# Patient Record
Sex: Female | Born: 1945
Health system: Southern US, Community
[De-identification: ages and names within clinical notes are randomized; demographics above are authoritative.]

## PROBLEM LIST (undated history)

## (undated) DIAGNOSIS — N200 Calculus of kidney: Secondary | ICD-10-CM

## (undated) DIAGNOSIS — N390 Urinary tract infection, site not specified: Secondary | ICD-10-CM

## (undated) HISTORY — PX: TONSILLECTOMY: SUR1361

## (undated) HISTORY — DX: Calculus of kidney: N20.0

## (undated) HISTORY — PX: OTHER SURGICAL HISTORY: SHX169

## (undated) HISTORY — PX: APPENDECTOMY: SHX54

---

## 1997-05-19 ENCOUNTER — Other Ambulatory Visit: Admission: RE | Admit: 1997-05-19 | Discharge: 1997-05-19 | Payer: Self-pay | Admitting: *Deleted

## 1998-04-27 ENCOUNTER — Other Ambulatory Visit: Admission: RE | Admit: 1998-04-27 | Discharge: 1998-04-27 | Payer: Self-pay | Admitting: *Deleted

## 2000-11-13 ENCOUNTER — Other Ambulatory Visit: Admission: RE | Admit: 2000-11-13 | Discharge: 2000-11-13 | Payer: Self-pay | Admitting: *Deleted

## 2001-11-13 ENCOUNTER — Other Ambulatory Visit: Admission: RE | Admit: 2001-11-13 | Discharge: 2001-11-13 | Payer: Self-pay | Admitting: *Deleted

## 2002-09-06 ENCOUNTER — Encounter: Payer: Self-pay | Admitting: *Deleted

## 2002-09-06 ENCOUNTER — Encounter: Admission: RE | Admit: 2002-09-06 | Discharge: 2002-09-06 | Payer: Self-pay | Admitting: *Deleted

## 2002-11-14 ENCOUNTER — Other Ambulatory Visit: Admission: RE | Admit: 2002-11-14 | Discharge: 2002-11-14 | Payer: Self-pay | Admitting: *Deleted

## 2006-05-03 ENCOUNTER — Other Ambulatory Visit: Admission: RE | Admit: 2006-05-03 | Discharge: 2006-05-03 | Payer: Self-pay | Admitting: *Deleted

## 2006-05-12 ENCOUNTER — Encounter: Admission: RE | Admit: 2006-05-12 | Discharge: 2006-05-12 | Payer: Self-pay | Admitting: Neurology

## 2006-05-22 ENCOUNTER — Encounter: Admission: RE | Admit: 2006-05-22 | Discharge: 2006-05-22 | Payer: Self-pay | Admitting: *Deleted

## 2007-06-18 ENCOUNTER — Encounter: Admission: RE | Admit: 2007-06-18 | Discharge: 2007-06-18 | Payer: Self-pay | Admitting: Gynecology

## 2007-06-18 ENCOUNTER — Other Ambulatory Visit: Admission: RE | Admit: 2007-06-18 | Discharge: 2007-06-18 | Payer: Self-pay | Admitting: Gynecology

## 2008-09-02 IMAGING — US US CAROTID DUPLEX BILAT
1 series · 13 of 24 positions shown · non-contrast
Comparison: none

CLINICAL DATA: Headaches.  Small vessel ischemic changes noted by MRI in the periventricular white matter.  
 BILATERAL CAROTID DUPLEX ULTRASOUND:
 No prior ultrasound studies for comparison. 
 The following Doppler flow velocity measurements were obtained (in cm/sec):

[Series 1: unknown · 0.09mm/px · 13 of 57 slices shown]
[im 1/57]
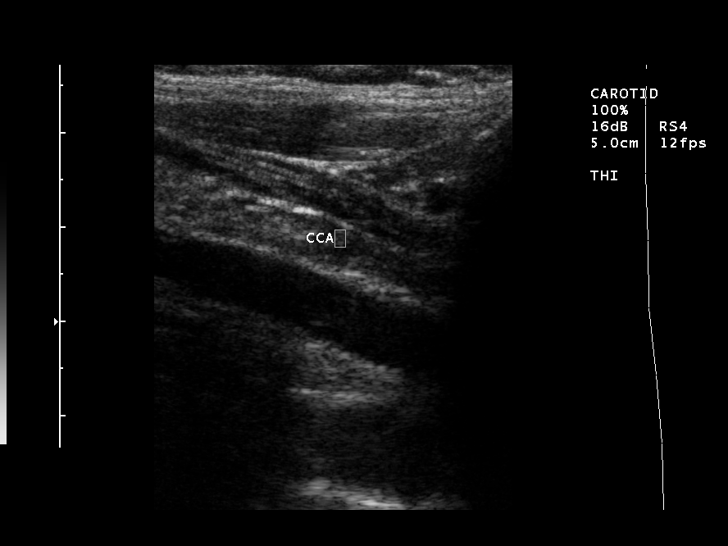
[im 5/57]
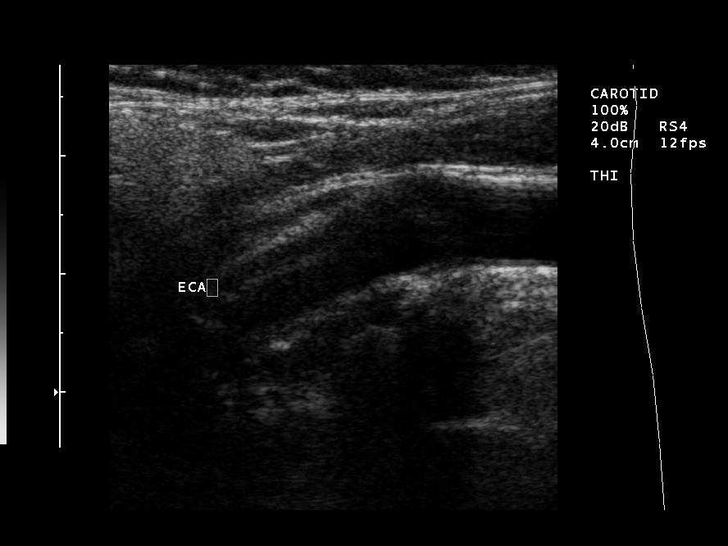
[im 10/57]
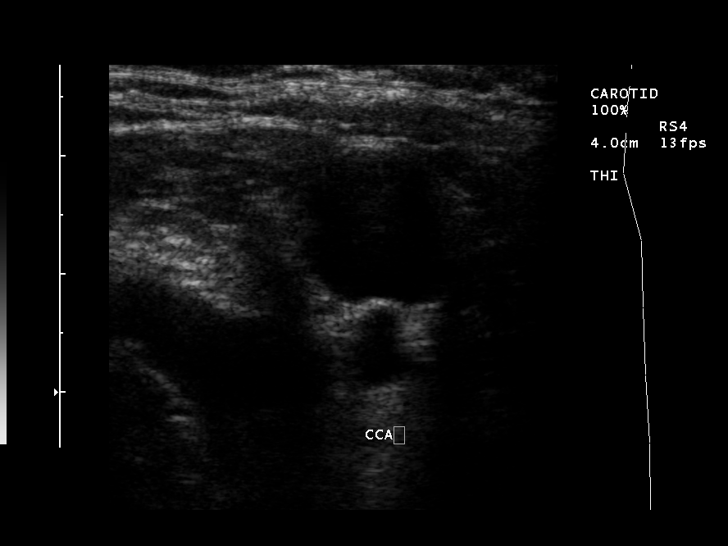
[im 15/57]
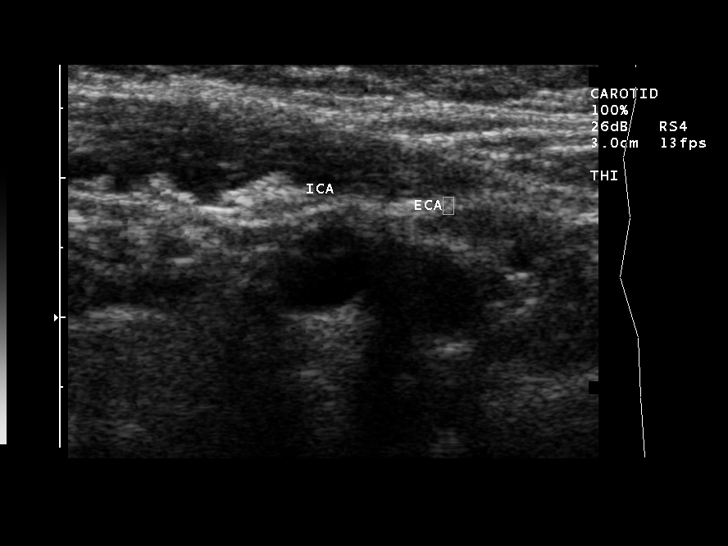
[im 20/57]
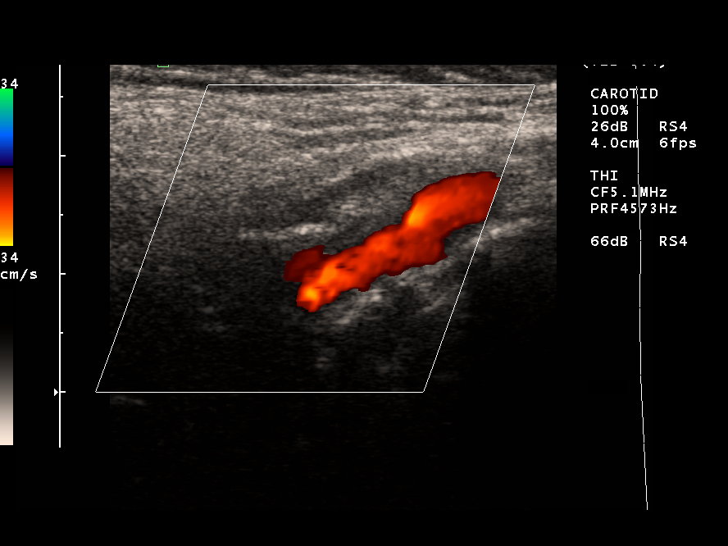
[im 25/57]
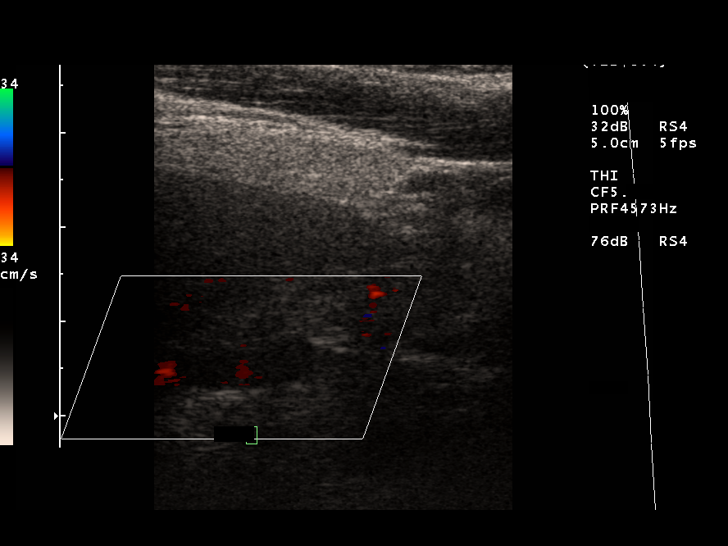
[im 30/57]
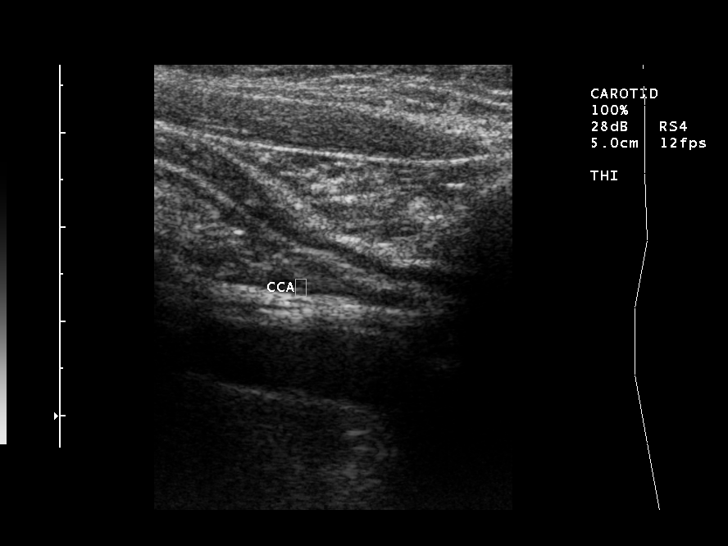
[im 32/57]
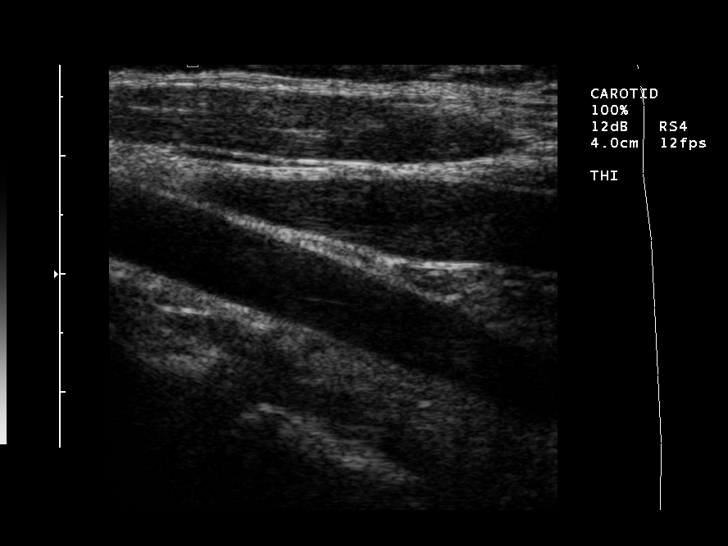
[im 37/57]
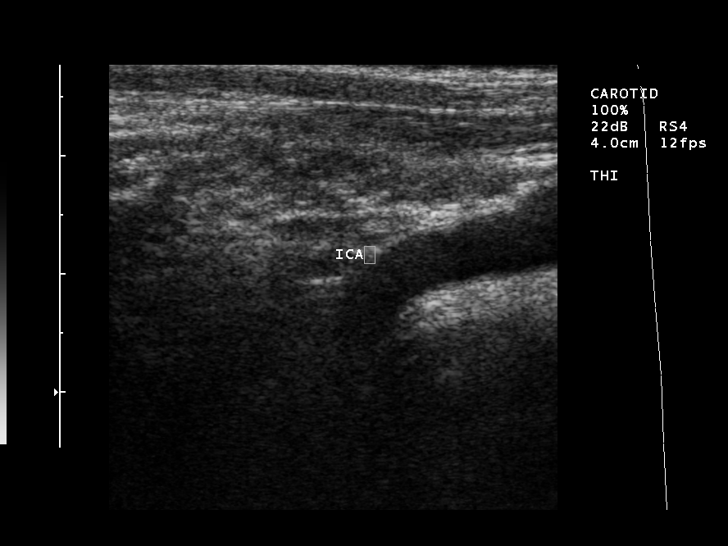
[im 42/57]
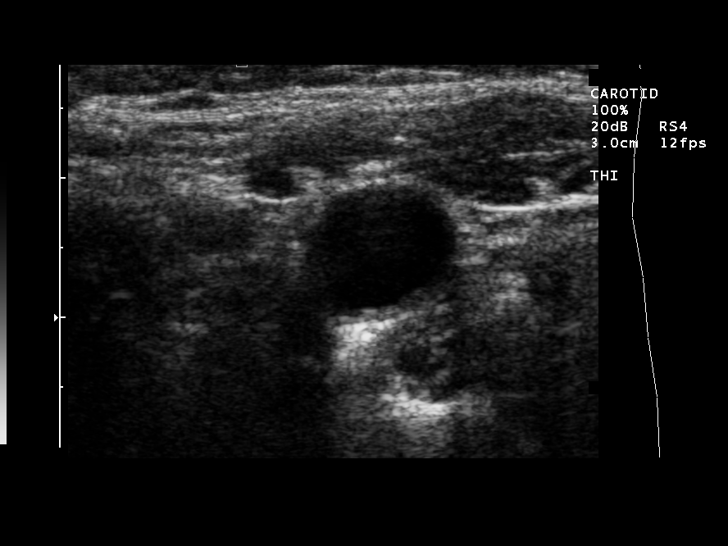
[im 47/57]
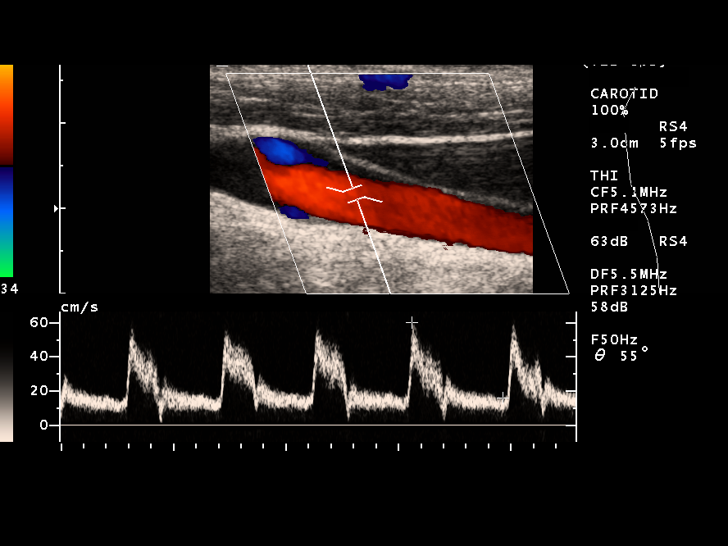
[im 52/57]
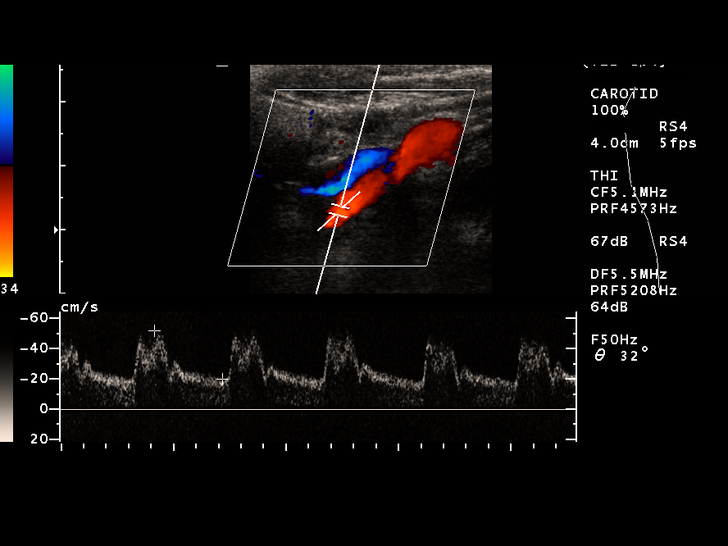
[im 57/57]
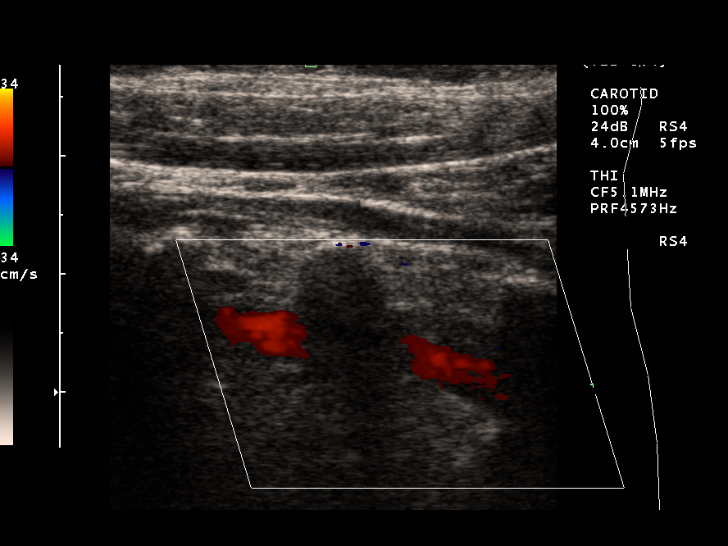

[13 of 24 positions shown; findings below may reference images not displayed]

SITE:  PEAK SYSTOLIC  END-DIASTOLIC

 RIGHT  ICA:    54    19
 RIGHT CCA:  71    22
 RIGHT ICA/CCA RATIO:
 RIGHT ECA:  47
 LEFT ICA:    51  19  
 LEFT CCA:  68  13  
 LEFT ICA/CCA RATIO:
 LEFT ECA:  50
 Criteria:  Quantification of carotid stenosis is based on velocity parameters that correlate the residual internal carotid diameter with NASCET-based stenosis levels.
FINDINGS: The right carotid bifurcation demonstrates a mild amount of calcified plaque at the level of the bulb and extending into the proximal origins of the internal carotid and external carotid arteries.  Waveform analysis demonstrates nonelevated velocities and normal waveforms in the right ICA.  The findings are consistent with an estimated less than 50% right ICA stenosis.  Antegrade flow is detected in the right vertebral artery.
 The left carotid bifurcation demonstrates a similar mild amount of plaque at the carotid bifurcation extending into proximal ICA.  Normal velocities are present with normal waveforms.  Estimated left ICA stenosis is less than 50% based on velocity criteria.  Antegrade flow is detected in the left vertebral artery.
IMPRESSION: No significant carotid occlusive disease identified in the neck by ultrasound.  Estimated less than 50% bilateral ICA stenoses based on velocity criteria. Mild amount of calcified plaque is present in both carotid bifurcations.

## 2012-03-26 ENCOUNTER — Ambulatory Visit: Payer: Self-pay | Admitting: Ophthalmology

## 2012-12-31 ENCOUNTER — Ambulatory Visit: Payer: Self-pay | Admitting: Women's Health

## 2013-01-05 ENCOUNTER — Emergency Department: Payer: Self-pay | Admitting: Emergency Medicine

## 2013-01-05 LAB — URINALYSIS, COMPLETE
Bilirubin,UR: NEGATIVE
GLUCOSE, UR: NEGATIVE mg/dL (ref 0–75)
Hyaline Cast: 1
Ketone: NEGATIVE
Nitrite: NEGATIVE
PH: 5 (ref 4.5–8.0)
Protein: NEGATIVE
RBC,UR: 7 /HPF (ref 0–5)
Specific Gravity: 1.021 (ref 1.003–1.030)
Squamous Epithelial: 13
WBC UR: 25 /HPF (ref 0–5)

## 2013-01-05 LAB — COMPREHENSIVE METABOLIC PANEL
ALBUMIN: 3.5 g/dL (ref 3.4–5.0)
ALK PHOS: 137 U/L — AB
Anion Gap: 3 — ABNORMAL LOW (ref 7–16)
BUN: 15 mg/dL (ref 7–18)
Bilirubin,Total: 0.2 mg/dL (ref 0.2–1.0)
CO2: 29 mmol/L (ref 21–32)
Calcium, Total: 9.2 mg/dL (ref 8.5–10.1)
Chloride: 106 mmol/L (ref 98–107)
Creatinine: 0.88 mg/dL (ref 0.60–1.30)
EGFR (African American): >60
Glucose: 91 mg/dL (ref 65–99)
Osmolality: 276 (ref 275–301)
Potassium: 3.9 mmol/L (ref 3.5–5.1)
SGOT(AST): 44 U/L — ABNORMAL HIGH (ref 15–37)
SGPT (ALT): 44 U/L (ref 12–78)
Sodium: 138 mmol/L (ref 136–145)
Total Protein: 7.8 g/dL (ref 6.4–8.2)

## 2013-01-05 LAB — CBC WITH DIFFERENTIAL/PLATELET
BASOS PCT: 0.6 %
Basophil #: 0 10*3/uL (ref 0.0–0.1)
Eosinophil #: 0.1 10*3/uL (ref 0.0–0.7)
Eosinophil %: 1.1 %
HCT: 42.6 % (ref 35.0–47.0)
HGB: 14.4 g/dL (ref 12.0–16.0)
LYMPHS PCT: 19.7 %
Lymphocyte #: 1.5 10*3/uL (ref 1.0–3.6)
MCH: 30.5 pg (ref 26.0–34.0)
MCHC: 33.8 g/dL (ref 32.0–36.0)
MCV: 90 fL (ref 80–100)
Monocyte #: 0.7 x10 3/mm (ref 0.2–0.9)
Monocyte %: 9.2 %
Neutrophil #: 5.2 10*3/uL (ref 1.4–6.5)
Neutrophil %: 69.4 %
PLATELETS: 272 10*3/uL (ref 150–440)
RBC: 4.72 10*6/uL (ref 3.80–5.20)
RDW: 13.3 % (ref 11.5–14.5)
WBC: 7.6 10*3/uL (ref 3.6–11.0)

## 2013-01-05 LAB — TROPONIN I

## 2013-01-05 LAB — LIPASE, BLOOD: LIPASE: 106 U/L (ref 73–393)

## 2013-01-07 LAB — URINE CULTURE

## 2013-01-29 ENCOUNTER — Ambulatory Visit: Payer: Self-pay | Admitting: Gynecology

## 2013-07-02 ENCOUNTER — Ambulatory Visit: Payer: Self-pay | Admitting: Ophthalmology

## 2013-07-08 ENCOUNTER — Ambulatory Visit: Payer: Self-pay | Admitting: Ophthalmology

## 2014-04-25 NOTE — Op Note (Signed)
PATIENT NAME:  Laura Gomez, Laura Gomez MR#:  [id number] DATE OF BIRTH:  1945-11-25  DATE OF SURGERY:  03/26/2012  PREOPERATIVE DIAGNOSIS: Cataract, left eye.   POSTOPERATIVE DIAGNOSIS: Cataract, left eye.   PROCEDURE PERFORMED: Extracapsular cataract extraction using phacoemulsification with placement of an Alcon SN6CWS, 23.0-diopter posterior chamber lens, serial number [id number].   ANESTHESIA: 4% lidocaine, 0.75% Marcaine in a 50:50 mixture with 10 units/mL of Hylenex added, given as a peribulbar.   ANESTHESIOLOGIST: Dr. Dimple Caseyice  COMPLICATIONS: None.   ESTIMATED BLOOD LOSS: Less than 1 mL.   SURGEON:  Maylon PeppersSteven A. Fruma Africa, MD  ESTIMATED BLOOD LOSS:  Less than 1 ml.  DESCRIPTION OF PROCEDURE:  The patient was brought to the operating room and given a peribulbar block.  The patient was then prepped and draped in the usual fashion.  The vertical rectus muscles were imbricated using 5-0 silk sutures.  These sutures were then clamped to the sterile drapes as bridle sutures.  A limbal peritomy was performed extending two clock hours and hemostasis was obtained with cautery.  A partial thickness scleral groove was made at the surgical limbus and dissected anteriorly in a lamellar dissection using an Alcon crescent knife.  The anterior chamber was entered supero-temporally with a Superblade and through the lamellar dissection with a 2.6 mm keratome.  DisCoVisc was used to replace the aqueous and a continuous tear capsulorrhexis was carried out.  Hydrodissection and hydrodelineation were carried out with balanced salt and a 27 gauge canula.  The nucleus was rotated to confirm the effectiveness of the hydrodissection.  Phacoemulsification was carried out using a divide-and-conquer technique.  Total ultrasound time was 1 minute and 7 seconds, with an average power of 24.3%, CDE of 30.93.  Irrigation/aspiration was used to remove the residual cortex.  DisCoVisc was used to inflate the capsule and the  internal incision was enlarged to 3 mm with the crescent knife.  A suture was placed. The intraocular lens was folded and inserted into the capsular bag using an AcrySert delivery system instead of a ParamedicMonarch shooter. Irrigation/aspiration was used to remove the residual DisCoVisc.  Miostat was injected into the anterior chamber through the paracentesis track to inflate the anterior chamber and induce miosis.  The wound was checked for leaks and none were found. The conjunctiva was closed with cautery and the bridle sutures were removed.  Two drops of 0.3% Vigamox were placed on the eye.   An eye shield was placed on the eye.  The patient was discharged to the recovery room in good condition.    ____________________________ Maylon PeppersSteven A. Sumedh Shinsato, MD sad:dm D: 03/26/2012 14:15:00 ET T: 03/26/2012 14:56:58 ET JOB#: [id number]  cc: Viviann SpareSteven A. Ilsa Bonello, MD, <Dictator> Erline LevineSTEVEN A Rudolph Dobler MD ELECTRONICALLY SIGNED 04/02/2012 13:40

## 2014-04-26 NOTE — Op Note (Signed)
PATIENT NAME:  Laura Gomez, Laura Gomez MR#:  [id number] DATE OF BIRTH:  [birth year]  DATE OF PROCEDURE:  07/08/2013  PREOPERATIVE DIAGNOSIS:  Cataract, right eye.   POSTOPERATIVE DIAGNOSIS:  Cataract, right eye.  PROCEDURE PERFORMED:  Extracapsular cataract extraction using phacoemulsification with placement of an Alcon SN6CWS, 22.0-diopter posterior chamber lens, serial [id number].  SURGEON:  Maylon PeppersSteven A. Mattisyn Cardona, MD  ASSISTANT:  None.  ANESTHESIA:  4% lidocaine and 0.75% Marcaine in a 50/50 mixture with 10 units/mL of Hylenex added, given as a peribulbar.  ANESTHESIOLOGIST:  Dr. Maisie Fushomas.   COMPLICATIONS:  None.  ESTIMATED BLOOD LOSS:  Less than 1 ml.  DESCRIPTION OF PROCEDURE:  The patient was brought to the operating room and given a peribulbar block.  The patient was then prepped and draped in the usual fashion.  The vertical rectus muscles were imbricated using 5-0 silk sutures.  These sutures were then clamped to the sterile drapes as bridle sutures.  A limbal peritomy was performed extending two clock hours and hemostasis was obtained with cautery.  A partial thickness scleral groove was made at the surgical limbus and dissected anteriorly in a lamellar dissection using an Alcon crescent knife.  The anterior chamber was entered superonasally with a Superblade and through the lamellar dissection with a 2.6 mm keratome.  DisCoVisc was used to replace the aqueous and a continuous tear capsulorrhexis was carried out.  Hydrodissection and hydrodelineation were carried out with balanced salt and a 27 gauge canula.  The nucleus was rotated to confirm the effectiveness of the hydrodissection.  Phacoemulsification was carried out using a divide-and-conquer technique.  Total ultrasound time was 1 minute and 21 seconds with an average power of 24.2 percent. CDE of 32.29. No suture was placed.   Irrigation/aspiration was used to remove the residual cortex.  DisCoVisc was used to inflate the capsule  and the internal incision was enlarged to 3 mm with the crescent knife.  The intraocular lens was folded and inserted into the capsular bag using the AcrySert delivery system. Irrigation/aspiration was used to remove the residual DisCoVisc.  Miostat was injected into the anterior chamber through the paracentesis track to inflate the anterior chamber and induce miosis.  A tenth of a milliliter of Vigamox containing 0.1 mg of drug was injected via the paracentesis track. The wound was checked for leaks and none were found. The conjunctiva was closed with cautery and the bridle sutures were removed.  Two drops of 0.3% Vigamox were placed on the eye.   An eye shield was placed on the eye.  The patient was discharged to the recovery room in good condition.   ____________________________ Maylon PeppersSteven A. Marcelis Wissner, MD sad:lt D: 07/08/2013 13:49:54 ET T: 07/09/2013 01:43:23 ET JOB#: [id number]  cc: Viviann SpareSteven A. Benjamine Strout, MD, <Dictator> Erline LevineSTEVEN A Lariza Cothron MD ELECTRONICALLY SIGNED 07/15/2013 12:52

## 2014-08-28 ENCOUNTER — Emergency Department
Admission: EM | Admit: 2014-08-28 | Discharge: 2014-08-29 | Disposition: A | Discharge status: Home / Self Care | Payer: Medicare Other | Attending: Emergency Medicine | Admitting: Emergency Medicine

## 2014-08-28 ENCOUNTER — Encounter: Payer: Self-pay | Admitting: *Deleted

## 2014-08-28 DIAGNOSIS — Z880 Allergy status to penicillin: Secondary | ICD-10-CM | POA: Insufficient documentation

## 2014-08-28 DIAGNOSIS — N200 Calculus of kidney: Secondary | ICD-10-CM

## 2014-08-28 DIAGNOSIS — N390 Urinary tract infection, site not specified: Secondary | ICD-10-CM

## 2014-08-28 DIAGNOSIS — R1031 Right lower quadrant pain: Secondary | ICD-10-CM | POA: Diagnosis present

## 2014-08-28 HISTORY — DX: Urinary tract infection, site not specified: N39.0

## 2014-08-28 LAB — CBC WITH DIFFERENTIAL/PLATELET
BASOS ABS: 0.1 10*3/uL (ref 0–0.1)
Basophils Relative: 1 %
EOS ABS: 0.2 10*3/uL (ref 0–0.7)
EOS PCT: 2 %
HCT: 41.7 % (ref 35.0–47.0)
Hemoglobin: 14.3 g/dL (ref 12.0–16.0)
LYMPHS ABS: 2 10*3/uL (ref 1.0–3.6)
LYMPHS PCT: 19 %
MCH: 30.9 pg (ref 26.0–34.0)
MCHC: 34.3 g/dL (ref 32.0–36.0)
MCV: 90.2 fL (ref 80.0–100.0)
MONO ABS: 0.8 10*3/uL (ref 0.2–0.9)
Monocytes Relative: 8 %
Neutro Abs: 7.4 10*3/uL — ABNORMAL HIGH (ref 1.4–6.5)
Neutrophils Relative %: 70 %
PLATELETS: 289 10*3/uL (ref 150–440)
RBC: 4.63 MIL/uL (ref 3.80–5.20)
RDW: 13.7 % (ref 11.5–14.5)
WBC: 10.4 10*3/uL (ref 3.6–11.0)

## 2014-08-28 LAB — COMPREHENSIVE METABOLIC PANEL
ALT: 15 U/L (ref 14–54)
AST: 25 U/L (ref 15–41)
Albumin: 4 g/dL (ref 3.5–5.0)
Alkaline Phosphatase: 108 U/L (ref 38–126)
Anion gap: 8 (ref 5–15)
BUN: 10 mg/dL (ref 6–20)
CHLORIDE: 105 mmol/L (ref 101–111)
CO2: 26 mmol/L (ref 22–32)
Calcium: 9.5 mg/dL (ref 8.9–10.3)
Creatinine, Ser: 1.02 mg/dL — ABNORMAL HIGH (ref 0.44–1.00)
GFR, EST NON AFRICAN AMERICAN: 55 mL/min — AB (ref >60)
Glucose, Bld: 114 mg/dL — ABNORMAL HIGH (ref 65–99)
POTASSIUM: 3.6 mmol/L (ref 3.5–5.1)
SODIUM: 139 mmol/L (ref 135–145)
Total Bilirubin: 0.5 mg/dL (ref 0.3–1.2)
Total Protein: 7.8 g/dL (ref 6.5–8.1)

## 2014-08-28 LAB — URINALYSIS COMPLETE WITH MICROSCOPIC (ARMC ONLY)
BACTERIA UA: NONE SEEN
BILIRUBIN URINE: NEGATIVE
Glucose, UA: NEGATIVE mg/dL
HGB URINE DIPSTICK: NEGATIVE
KETONES UR: NEGATIVE mg/dL
NITRITE: NEGATIVE
PH: 6 (ref 5.0–8.0)
Protein, ur: NEGATIVE mg/dL
RBC / HPF: NONE SEEN RBC/hpf (ref 0–5)
SPECIFIC GRAVITY, URINE: 1.019 (ref 1.005–1.030)

## 2014-08-28 MED ORDER — SODIUM CHLORIDE 0.9 % IV SOLN
1000.0000 mL | Freq: Once | INTRAVENOUS | Status: AC
  Administered 2014-08-28: 1000 mL via INTRAVENOUS

## 2014-08-28 MED ORDER — MORPHINE SULFATE (PF) 4 MG/ML IV SOLN
4.0000 mg | Freq: Once | INTRAVENOUS | Status: AC
  Administered 2014-08-28: 4 mg via INTRAVENOUS
  Filled 2014-08-28: qty 1

## 2014-08-28 MED ORDER — ONDANSETRON HCL 4 MG/2ML IJ SOLN
4.0000 mg | Freq: Once | INTRAMUSCULAR | Status: AC
  Administered 2014-08-28: 4 mg via INTRAVENOUS
  Filled 2014-08-28: qty 2

## 2014-08-28 NOTE — ED Notes (Signed)
Patient c/o right groin pain that began yesterday. Patient states she cannot weight-bear on right leg due to the pain. Patient states she has a hx. Of UTI and the pain is in the same place. Patient denies frequency, pain or burning with urination.

## 2014-08-29 ENCOUNTER — Emergency Department: Payer: Medicare Other

## 2014-08-29 ENCOUNTER — Encounter: Payer: Self-pay | Admitting: Emergency Medicine

## 2014-08-29 ENCOUNTER — Emergency Department
Admission: EM | Admit: 2014-08-29 | Discharge: 2014-08-29 | Disposition: A | Discharge status: Home / Self Care | Payer: Medicare Other | Source: Home / Self Care | Attending: Emergency Medicine | Admitting: Emergency Medicine

## 2014-08-29 DIAGNOSIS — N3090 Cystitis, unspecified without hematuria: Secondary | ICD-10-CM | POA: Insufficient documentation

## 2014-08-29 DIAGNOSIS — Z792 Long term (current) use of antibiotics: Secondary | ICD-10-CM

## 2014-08-29 DIAGNOSIS — Z880 Allergy status to penicillin: Secondary | ICD-10-CM | POA: Insufficient documentation

## 2014-08-29 LAB — URINALYSIS COMPLETE WITH MICROSCOPIC (ARMC ONLY)
Bacteria, UA: NONE SEEN
Bilirubin Urine: NEGATIVE
GLUCOSE, UA: NEGATIVE mg/dL
HGB URINE DIPSTICK: NEGATIVE
Nitrite: NEGATIVE
PH: 5 (ref 5.0–8.0)
Protein, ur: NEGATIVE mg/dL
SPECIFIC GRAVITY, URINE: 1.023 (ref 1.005–1.030)

## 2014-08-29 LAB — COMPREHENSIVE METABOLIC PANEL
ALBUMIN: 3.9 g/dL (ref 3.5–5.0)
ALK PHOS: 100 U/L (ref 38–126)
ALT: 14 U/L (ref 14–54)
AST: 22 U/L (ref 15–41)
Anion gap: 8 (ref 5–15)
BUN: 8 mg/dL (ref 6–20)
CALCIUM: 9.3 mg/dL (ref 8.9–10.3)
CO2: 26 mmol/L (ref 22–32)
CREATININE: 0.81 mg/dL (ref 0.44–1.00)
Chloride: 104 mmol/L (ref 101–111)
GFR calc Af Amer: >60 mL/min (ref >60)
GFR calc non Af Amer: >60 mL/min (ref >60)
GLUCOSE: 118 mg/dL — AB (ref 65–99)
Potassium: 3.7 mmol/L (ref 3.5–5.1)
SODIUM: 138 mmol/L (ref 135–145)
Total Bilirubin: 0.8 mg/dL (ref 0.3–1.2)
Total Protein: 7.3 g/dL (ref 6.5–8.1)

## 2014-08-29 LAB — CBC
HCT: 40.2 % (ref 35.0–47.0)
Hemoglobin: 13.2 g/dL (ref 12.0–16.0)
MCH: 29.9 pg (ref 26.0–34.0)
MCHC: 32.8 g/dL (ref 32.0–36.0)
MCV: 91.1 fL (ref 80.0–100.0)
PLATELETS: 286 10*3/uL (ref 150–440)
RBC: 4.42 MIL/uL (ref 3.80–5.20)
RDW: 13.3 % (ref 11.5–14.5)
WBC: 9.8 10*3/uL (ref 3.6–11.0)

## 2014-08-29 MED ORDER — SODIUM CHLORIDE 0.9 % IV BOLUS (SEPSIS)
1000.0000 mL | Freq: Once | INTRAVENOUS | Status: AC
  Administered 2014-08-29: 1000 mL via INTRAVENOUS

## 2014-08-29 MED ORDER — MORPHINE SULFATE (PF) 2 MG/ML IV SOLN
2.0000 mg | Freq: Once | INTRAVENOUS | Status: AC
  Administered 2014-08-29: 2 mg via INTRAVENOUS
  Filled 2014-08-29: qty 1

## 2014-08-29 MED ORDER — NITROFURANTOIN MONOHYD MACRO 100 MG PO CAPS
100.0000 mg | ORAL_CAPSULE | Freq: Once | ORAL | Status: AC
  Administered 2014-08-29: 100 mg via ORAL
  Filled 2014-08-29: qty 1

## 2014-08-29 MED ORDER — HYDROCODONE-ACETAMINOPHEN 5-325 MG PO TABS: 1.0000 | ORAL_TABLET | Freq: Four times a day (QID) | ORAL | Status: DC | PRN

## 2014-08-29 MED ORDER — ONDANSETRON HCL 4 MG/2ML IJ SOLN
4.0000 mg | Freq: Once | INTRAMUSCULAR | Status: AC
  Administered 2014-08-29: 4 mg via INTRAVENOUS
  Filled 2014-08-29: qty 2

## 2014-08-29 MED ORDER — IBUPROFEN 600 MG PO TABS: 600.0000 mg | ORAL_TABLET | Freq: Three times a day (TID) | ORAL | Status: AC | PRN

## 2014-08-29 MED ORDER — IOHEXOL 240 MG/ML SOLN
50.0000 mL | INTRAMUSCULAR | Status: AC
  Administered 2014-08-29: 50 mL via ORAL

## 2014-08-29 MED ORDER — ONDANSETRON 4 MG PO TBDP
4.0000 mg | ORAL_TABLET | Freq: Once | ORAL | Status: AC
  Administered 2014-08-29: 4 mg via ORAL
  Filled 2014-08-29: qty 1

## 2014-08-29 MED ORDER — SULFAMETHOXAZOLE-TRIMETHOPRIM 800-160 MG PO TABS: 1.0000 | ORAL_TABLET | Freq: Once | ORAL | Status: DC

## 2014-08-29 MED ORDER — HYDROCODONE-ACETAMINOPHEN 5-325 MG PO TABS
1.0000 | ORAL_TABLET | Freq: Once | ORAL | Status: AC
  Administered 2014-08-29: 1 via ORAL
  Filled 2014-08-29: qty 1

## 2014-08-29 MED ORDER — KETOROLAC TROMETHAMINE 30 MG/ML IJ SOLN
30.0000 mg | Freq: Once | INTRAMUSCULAR | Status: AC
  Administered 2014-08-29: 30 mg via INTRAVENOUS
  Filled 2014-08-29: qty 1

## 2014-08-29 MED ORDER — NITROFURANTOIN MONOHYD MACRO 100 MG PO CAPS: 100.0000 mg | ORAL_CAPSULE | Freq: Two times a day (BID) | ORAL | Status: DC

## 2014-08-29 MED ORDER — IOHEXOL 300 MG/ML  SOLN
100.0000 mL | Freq: Once | INTRAMUSCULAR | Status: AC | PRN
  Administered 2014-08-29: 100 mL via INTRAVENOUS

## 2014-08-29 NOTE — Discharge Instructions (Signed)
1. Take antibiotic as prescribed (Macrobid 100 mg twice daily 5 days). 2. Take pain medicines as needed (Motrin/Norco #15). 3. Return to the ER for worsening symptoms, persistent vomiting, fever or other concerns.  Urinary Tract Infection A urinary tract infection (UTI) can occur any place along the urinary tract. The tract includes the kidneys, ureters, bladder, and urethra. A type of germ called bacteria often causes a UTI. UTIs are often helped with antibiotic medicine.  HOME CARE   If given, take antibiotics as told by your doctor. Finish them even if you start to feel better.  Drink enough fluids to keep your pee (urine) clear or pale yellow.  Avoid tea, drinks with caffeine, and bubbly (carbonated) drinks.  Pee often. Avoid holding your pee in for a long time.  Pee before and after having sex (intercourse).  Wipe from front to back after you poop (bowel movement) if you are a woman. Use each tissue only once. GET HELP RIGHT AWAY IF:   You have back pain.  You have lower belly (abdominal) pain.  You have chills.  You feel sick to your stomach (nauseous).  You throw up (vomit).  Your burning or discomfort with peeing does not go away.  You have a fever.  Your symptoms are not better in 3 days. MAKE SURE YOU:   Understand these instructions.  Will watch your condition.  Will get help right away if you are not doing well or get worse. Document Released: 06/08/2007 Document Revised: 09/14/2011 Document Reviewed: 07/21/2011 [facility] Patient Information 2015 [place], [place]. This information is not intended to replace advice given to you by your health care provider. Make sure you discuss any questions you have with your health care provider.  Kidney Stones Kidney stones (urolithiasis) are solid masses that form inside your kidneys. The intense pain is caused by the stone moving through the kidney, ureter, bladder, and urethra (urinary tract). When the stone moves, the  ureter starts to spasm around the stone. The stone is usually passed in your pee (urine).  HOME CARE  Drink enough fluids to keep your pee clear or pale yellow. This helps to get the stone out.  Strain all pee through the provided strainer. Do not pee without peeing through the strainer, not even once. If you pee the stone out, catch it in the strainer. The stone may be as small as a grain of salt. Take this to your doctor. This will help your doctor figure out what you can do to try to prevent more kidney stones.  Only take medicine as told by your doctor.  Follow up with your doctor as told.  Get follow-up X-rays as told by your doctor. GET HELP IF: You have pain that gets worse even if you have been taking pain medicine. GET HELP RIGHT AWAY IF:   Your pain does not get better with medicine.  You have a fever or shaking chills.  Your pain increases and gets worse over 18 hours.  You have new belly (abdominal) pain.  You feel faint or pass out.  You are unable to pee. MAKE SURE YOU:   Understand these instructions.  Will watch your condition.  Will get help right away if you are not doing well or get worse. Document Released: 06/08/2007 Document Revised: 08/22/2012 Document Reviewed: 05/23/2012 [facility] Patient Information 2015 [place], [place]. This information is not intended to replace advice given to you by your health care provider. Make sure you discuss any questions you have with  your health care provider. ° °

## 2014-08-29 NOTE — ED Notes (Signed)
Pt. Going home with husband. 

## 2014-08-29 NOTE — ED Provider Notes (Signed)
[facility] Emergency Department Provider Note  ____________________________________________  Time seen: 6:55 PM  I have reviewed the triage vital signs and the nursing notes.   HISTORY  Chief Complaint Flank Pain    HPI Laura Gomez is a 69 y.o. female who complains of right lower quadrant abdominal pain for 2 days. She was seen in the ED last night for the same had a noncontrast CT scan which showed some small punctate nephrolithiasis and she was given Macrobid for possible urinary tract infection. She reports the pain is still ongoing despite taking 1 or 2 doses of Macrobid today. She has NSAIDs and Norco for pain. Denies any nausea vomiting diarrhea fever chills chest pain shortness of breath dizziness or syncope. Eating and drinking normally     Past Medical History  Diagnosis Date  . UTI (lower urinary tract infection)     There are no active problems to display for this patient.   Past Surgical History  Procedure Laterality Date  . Appendectomy    . Tonsillectomy    . Cesarean section      Current Outpatient Rx  Name  Route  Sig  Dispense  Refill  . HYDROcodone-acetaminophen (NORCO) 5-325 MG per tablet   Oral   Take 1 tablet by mouth every 6 (six) hours as needed for moderate pain.   15 tablet   0   . ibuprofen (ADVIL,MOTRIN) 600 MG tablet   Oral   Take 1 tablet (600 mg total) by mouth every 8 (eight) hours as needed.   15 tablet   0   . nitrofurantoin, macrocrystal-monohydrate, (MACROBID) 100 MG capsule   Oral   Take 1 capsule (100 mg total) by mouth 2 (two) times daily.   10 capsule   0     Allergies Penicillins  History reviewed. No pertinent family history.  Social History Social History  Substance Use Topics  . Smoking status: Never Smoker   . Smokeless tobacco: None  . Alcohol Use: No    Review of Systems  Constitutional: No fever or chills. No weight changes Eyes:No blurry vision or double vision.   ENT: No sore throat. Cardiovascular: No chest pain. Respiratory: No dyspnea or cough. Gastrointestinal: Right  lower quadrant pain as above without vomiting or diarrhea. No brBPR or melena. Genitourinary: Negative for dysuria, urinary retention, bloody urine, or difficulty urinating. Musculoskeletal: Negative for back pain. No joint swelling or pain. Skin: Negative for rash. Neurological: Negative for headaches, focal weakness or numbness. Psychiatric:No anxiety or depression.   Endocrine:No hot/cold intolerance, changes in energy, or sleep difficulty.  10-point ROS otherwise negative.  ____________________________________________   PHYSICAL EXAM:  VITAL SIGNS: ED Triage Vitals  Enc Vitals Group     BP 08/29/14 1628 120/63 mmHg     Pulse Rate 08/29/14 1628 77     Resp 08/29/14 1628 16     Temp 08/29/14 1628 97.6 F (36.4 C)     Temp Source 08/29/14 1628 Oral     SpO2 08/29/14 1628 96 %     Weight 08/29/14 1628 147 lb (66.679 kg)     Height 08/29/14 1628 5' (1.524 m)     Head Cir --      Peak Flow --      Pain Score 08/29/14 1629 10     Pain Loc --      Pain Edu? --      Excl. in GC? --      Constitutional: Alert and oriented. Well  appearing and in no distress. Eyes: No scleral icterus. No conjunctival pallor. PERRL. EOMI ENT   Head: Normocephalic and atraumatic.   Nose: No congestion/rhinnorhea. No septal hematoma   Mouth/Throat: MMM, no pharyngeal erythema. No peritonsillar mass. No uvula shift.   Neck: No stridor. No SubQ emphysema. No meningismus. Hematological/Lymphatic/Immunilogical: No cervical lymphadenopathy. Cardiovascular: RRR. Normal and symmetric distal pulses are present in all extremities. No murmurs, rubs, or gallops. Respiratory: Normal respiratory effort without tachypnea nor retractions. Breath sounds are clear and equal bilaterally. No wheezes/rales/rhonchi. Gastrointestinsoft with right lower quadrant tenderness. No distention.  There is no CVA tenderness.  No rebound, rigidity, or guarding. Genitourinary: deferred Musculoskeletal: Nontender with normal range of motion in all extremities. No joint effusions.  No lower extremity tenderness.  No edema. Neurologic:   Normal speech and language.  CN 2-10 normal. Motor grossly intact. No pronator drift.  Normal gait. No gross focal neurologic deficits are appreciated.  Skin:  Skin is warm, dry and intact. No rash noted.  No petechiae, purpura, or bullae. Psychiatric: Mood and affect are normal. Speech and behavior are normal. Patient exhibits appropriate insight and judgment.  ____________________________________________    LABS (pertinent positives/negatives) (all labs ordered are listed, but only abnormal results are displayed) Labs Reviewed  COMPREHENSIVE METABOLIC PANEL - Abnormal; Notable for the following:    Glucose, Bld 118 (*)    All other components within normal limits  URINALYSIS COMPLETEWITH MICROSCOPIC ([facility] ONLY) - Abnormal; Notable for the following:    Color, Urine YELLOW (*)    APPearance CLEAR (*)    Ketones, ur TRACE (*)    Leukocytes, UA 3+ (*)    Squamous Epithelial / LPF 0-5 (*)    All other components within normal limits  URINE CULTURE  CBC   ____________________________________________   EKG    ____________________________________________    RADIOLOGY  CT abdomen pelvis with contrast unremarkable  ____________________________________________   PROCEDURES  ____________________________________________   INITIAL IMPRESSION / ASSESSMENT AND PLAN / ED COURSE  Pertinent labs & imaging results that were available during my care of the patient were reviewed by me and considered in my medical decision making (see chart for detailspatient has focal tenderness but is status post appendectomy. Due to the noncontrast scan she had yesterday, we will repeat with IV contrast due to possibility of missing colitis or other  inflammatory conditions. We'll repeat a urinalysis as well although the patient denies any dysuria frequency urgency.  ----------------------------------------- 10:00 PM on 08/29/2014 -----------------------------------------  Remains stable with normal vital signs. Workup negative except for clear urinary tract infection on CT. This appears to be cystitis for which the patient is already taking Macrobid and has pain medicines. I'll advise her to continue all medications and follow-up with her doctor in a week for repeat urine testing. ____________________________________________   FINAL CLINICAL IMPRESSION(S) / ED DIAGNOSES  Final diagnoses:  Cystitis      Sharman Cheek, MD 08/29/14 2201

## 2014-08-29 NOTE — ED Notes (Signed)
Sister in law at bedside.

## 2014-08-29 NOTE — ED Notes (Signed)
Patient comes from home with RLQ pain. Treated last night for UTI and kidney stones. Pain is constant

## 2014-08-29 NOTE — ED Notes (Signed)
MD at bedside. Pt with multiple tiny stones in both kidneys.

## 2014-08-29 NOTE — Discharge Instructions (Signed)
Your urine test today shows a clear urinary tract infection. Continue taking the Macrobid. This may take 2-3 days for your symptoms to resolve, but it will get better. We sent a culture to verify that the infection will be susceptible to Macrobid. If the antibiotic needs to be changed, you will receive a call within the next 2-3 days.  Urinary Tract Infection Urinary tract infections (UTIs) can develop anywhere along your urinary tract. Your urinary tract is your body's drainage system for removing wastes and extra water. Your urinary tract includes two kidneys, two ureters, a bladder, and a urethra. Your kidneys are a pair of bean-shaped organs. Each kidney is about the size of your fist. They are located below your ribs, one on each side of your spine. CAUSES Infections are caused by microbes, which are microscopic organisms, including fungi, viruses, and bacteria. These organisms are so small that they can only be seen through a microscope. Bacteria are the microbes that most commonly cause UTIs. SYMPTOMS  Symptoms of UTIs may vary by age and gender of the patient and by the location of the infection. Symptoms in young women typically include a frequent and intense urge to urinate and a painful, burning feeling in the bladder or urethra during urination. Older women and men are more likely to be tired, shaky, and weak and have muscle aches and abdominal pain. A fever may mean the infection is in your kidneys. Other symptoms of a kidney infection include pain in your back or sides below the ribs, nausea, and vomiting. DIAGNOSIS To diagnose a UTI, your caregiver will ask you about your symptoms. Your caregiver also will ask to provide a urine sample. The urine sample will be tested for bacteria and white blood cells. White blood cells are made by your body to help fight infection. TREATMENT  Typically, UTIs can be treated with medication. Because most UTIs are caused by a bacterial infection, they  usually can be treated with the use of antibiotics. The choice of antibiotic and length of treatment depend on your symptoms and the type of bacteria causing your infection. HOME CARE INSTRUCTIONS  If you were prescribed antibiotics, take them exactly as your caregiver instructs you. Finish the medication even if you feel better after you have only taken some of the medication.  Drink enough water and fluids to keep your urine clear or pale yellow.  Avoid caffeine, tea, and carbonated beverages. They tend to irritate your bladder.  Empty your bladder often. Avoid holding urine for long periods of time.  Empty your bladder before and after sexual intercourse.  After a bowel movement, women should cleanse from front to back. Use each tissue only once. SEEK MEDICAL CARE IF:   You have back pain.  You develop a fever.  Your symptoms do not begin to resolve within 3 days. SEEK IMMEDIATE MEDICAL CARE IF:   You have severe back pain or lower abdominal pain.  You develop chills.  You have nausea or vomiting.  You have continued burning or discomfort with urination. MAKE SURE YOU:   Understand these instructions.  Will watch your condition.  Will get help right away if you are not doing well or get worse. Document Released: 09/29/2004 Document Revised: 06/21/2011 Document Reviewed: 01/28/2011 [facility] Patient Information 2015 [place], [place]. This information is not intended to replace advice given to you by your health care provider. Make sure you discuss any questions you have with your health care provider.

## 2014-08-29 NOTE — ED Notes (Signed)
Family at bedside. 

## 2014-08-29 NOTE — ED Notes (Signed)
Pt with c/o right groin pain, nagging. Last BM today, denies nausea. Stated last time it was an UTI.

## 2014-08-29 NOTE — ED Notes (Signed)
MD at bedside, aware of no pain relief from Toradol. Order received.

## 2014-08-29 NOTE — ED Provider Notes (Signed)
[facility] Emergency Department Provider Note  ____________________________________________  Time seen: Approximately 12:38 AM  I have reviewed the triage vital signs and the nursing notes.   HISTORY  Chief Complaint Urinary Tract Infection    HPI Laura Gomez is a 69 y.o. female presents to the ED from home with a chief complaint of right groin pain. Patient states onset of nontraumatic right groin pain yesterday, initially waxing/waning, now constant today. Describes sharp, stabbing pain which radiates to right flank. States similar pain when she has a UTI. History of nephrolithiasis over 30 years ago. Patient denies fever, chills, chest pain, shortness of breath, abdominal pain, nausea, vomiting, dysuria.Nothing makes the pain better or worse.   Past Medical History  Diagnosis Date  . UTI (lower urinary tract infection)   Nephrolithiasis  There are no active problems to display for this patient.   Past Surgical History  Procedure Laterality Date  . Appendectomy    . Tonsillectomy    . Cesarean section      No current outpatient prescriptions on file.  Allergies Penicillins  History reviewed. No pertinent family history.  Social History Social History  Substance Use Topics  . Smoking status: Never Smoker   . Smokeless tobacco: None  . Alcohol Use: No    Review of Systems Constitutional: No fever/chills Eyes: No visual changes. ENT: No sore throat. Cardiovascular: Denies chest pain. Respiratory: Denies shortness of breath. Gastrointestinal: No abdominal pain.  No nausea, no vomiting.  No diarrhea.  No constipation. Genitourinary: Negative for dysuria. Musculoskeletal: Positive for right groin and back pain. Skin: Negative for rash. Neurological: Negative for headaches, focal weakness or numbness.  10-point ROS otherwise negative.  ____________________________________________   PHYSICAL EXAM:  VITAL SIGNS: ED Triage  Vitals  Enc Vitals Group     BP 08/28/14 2201 155/93 mmHg     Pulse Rate 08/28/14 2201 88     Resp 08/28/14 2201 20     Temp 08/28/14 2201 98.4 F (36.9 C)     Temp Source 08/28/14 2201 Oral     SpO2 08/28/14 2201 97 %     Weight 08/28/14 2201 147 lb (66.679 kg)     Height 08/28/14 2201 5' (1.524 m)     Head Cir --      Peak Flow --      Pain Score 08/28/14 2202 10     Pain Loc --      Pain Edu? --      Excl. in GC? --     Constitutional: Alert and oriented. Well appearing and in no acute distress. Eyes: Conjunctivae are normal. PERRL. EOMI. Head: Atraumatic. Nose: No congestion/rhinnorhea. Mouth/Throat: Mucous membranes are moist.  Oropharynx non-erythematous. Neck: No stridor.   Cardiovascular: Normal rate, regular rhythm. Grossly normal heart sounds.  Good peripheral circulation. Respiratory: Normal respiratory effort.  No retractions. Lungs CTAB. Gastrointestinal: Soft and mildly tender to palpation right lower quadrant without rebound or guarding. No distention. No abdominal bruits. No CVA tenderness. Musculoskeletal:  Right lower extremity: 2+ femoral and distal pulses. No palpable masses in inguinal canal. Leg is symmetrically warm without evidence for ischemia. Calf is supple and nontender with negative Homans sign without evidence of compartment syndrome. Full range of motion of hip without pain. No joint effusion, warmth or erythema to suggest septic joint. Neurologic:  Normal speech and language. No gross focal neurologic deficits are appreciated. No gait instability. Ambulated well to room from lobby. Skin:  Skin is warm, dry and  intact. No rash noted. Psychiatric: Mood and affect are normal. Speech and behavior are normal.  ____________________________________________   LABS (all labs ordered are listed, but only abnormal results are displayed)  Labs Reviewed  CBC WITH DIFFERENTIAL/PLATELET - Abnormal; Notable for the following:    Neutro Abs 7.4 (*)    All  other components within normal limits  COMPREHENSIVE METABOLIC PANEL - Abnormal; Notable for the following:    Glucose, Bld 114 (*)    Creatinine, Ser 1.02 (*)    GFR calc non Af Amer 55 (*)    All other components within normal limits  URINALYSIS COMPLETEWITH MICROSCOPIC ([facility] ONLY) - Abnormal; Notable for the following:    Color, Urine YELLOW (*)    APPearance CLOUDY (*)    Leukocytes, UA TRACE (*)    Squamous Epithelial / LPF 0-5 (*)    All other components within normal limits   ____________________________________________  EKG  None ____________________________________________  RADIOLOGY  CT renal stone study interpreted per Dr. Andria Meuse: Punctate size nonobstructing intrarenal stones bilaterally. No ureteral stone or obstruction. Multiple pulmonary nodules, largest measuring about 5 mm diameter. These are stable since previous study. Additional followup in 1 year suggested.  ____________________________________________   PROCEDURES  Procedure(s) performed: None  Critical Care performed: No  ____________________________________________   INITIAL IMPRESSION / ASSESSMENT AND PLAN / ED COURSE  Pertinent labs & imaging results that were available during my care of the patient were reviewed by me and considered in my medical decision making (see chart for details).  69 year old female who presents with right lower quadrant to right flank pain who thinks her symptoms are related to UTI as she has experienced similar symptoms previously. Laboratory results notable for cloudy urine with trace leukocytes. Plan for analgesia and will obtain CT abdomen/pelvis as patient's last kidney stone in over 30 years ago required lithotripsy.  ----------------------------------------- 2:42 AM on 08/29/2014 -----------------------------------------  Patient slightly improved. Toradol ordered for additional pain relief. Discussed with patient and friend - Will start antibiotics  treatment with Macrobid. Close follow-up with PCP. Strict return precautions given. Patient verbalizes understanding and agrees with plan of care.  ----------------------------------------- 4:10 AM on 08/29/2014 -----------------------------------------  Minimal relief after Toradol. Patient desires to leave. Reexamined patient and watched her ambulate with steady gait. Again she has 2+ femoral pulses with warm bilateral lower extremities without suggestion for ischemia. Lower extremity compartments are supple without evidence of compartment syndrome. Patient repeatedly states these are symptoms that she has when she has a UTI. Question if patient has a tiny kidney stone that is not visualized on CT scan. PO Norco ordered to be given to patient prior to discharge. She will remain on Macrobid 5 days. She is to call her PCP for recheck this afternoon.  ____________________________________________   FINAL CLINICAL IMPRESSION(S) / ED DIAGNOSES  Final diagnoses:  UTI (lower urinary tract infection)  Nephrolithiasis      Irean Hong, MD 08/29/14 [phone]

## 2014-08-31 ENCOUNTER — Emergency Department: Payer: Medicare Other

## 2014-08-31 ENCOUNTER — Encounter: Payer: Self-pay | Admitting: Emergency Medicine

## 2014-08-31 ENCOUNTER — Emergency Department
Admission: EM | Admit: 2014-08-31 | Discharge: 2014-08-31 | Disposition: A | Discharge status: Home / Self Care | Payer: Medicare Other | Attending: Emergency Medicine | Admitting: Emergency Medicine

## 2014-08-31 DIAGNOSIS — Z880 Allergy status to penicillin: Secondary | ICD-10-CM | POA: Diagnosis not present

## 2014-08-31 DIAGNOSIS — R1031 Right lower quadrant pain: Secondary | ICD-10-CM | POA: Diagnosis present

## 2014-08-31 DIAGNOSIS — R102 Pelvic and perineal pain: Secondary | ICD-10-CM | POA: Insufficient documentation

## 2014-08-31 DIAGNOSIS — Z87440 Personal history of urinary (tract) infections: Secondary | ICD-10-CM | POA: Insufficient documentation

## 2014-08-31 DIAGNOSIS — Z79899 Other long term (current) drug therapy: Secondary | ICD-10-CM | POA: Insufficient documentation

## 2014-08-31 LAB — URINALYSIS COMPLETE WITH MICROSCOPIC (ARMC ONLY)
Bilirubin Urine: NEGATIVE
Glucose, UA: NEGATIVE mg/dL
NITRITE: NEGATIVE
PROTEIN: NEGATIVE mg/dL
SPECIFIC GRAVITY, URINE: 1.012 (ref 1.005–1.030)
pH: 6 (ref 5.0–8.0)

## 2014-08-31 MED ORDER — OXYCODONE-ACETAMINOPHEN 5-325 MG PO TABS
2.0000 | ORAL_TABLET | Freq: Once | ORAL | Status: AC
  Administered 2014-08-31: 2 via ORAL
  Filled 2014-08-31: qty 2

## 2014-08-31 MED ORDER — OXYCODONE-ACETAMINOPHEN 5-325 MG PO TABS: 1.0000 | ORAL_TABLET | Freq: Four times a day (QID) | ORAL | Status: DC | PRN

## 2014-08-31 NOTE — ED Provider Notes (Signed)
[facility] Emergency Department Provider Note  Time seen: 10:00 AM  I have reviewed the triage vital signs and the nursing notes.   HISTORY  Chief Complaint Abdominal Pain    HPI Laura Gomez is a 69 y.o. female with a past medical history of urinary tract infections presents to the emergency department for right lower quadrant pain. According to the patient and this has been ongoing for the past 4 days now. Patient has been seen twice in the emergency department prior to today. Patient has been diagnosed with urinary tract infection placed on Macrobid. Patient states the pain continues so she came to the emergency department for evaluation as she cannot sleep last night due to the pain. Describes the pain as moderate, located in the right lower quadrant/right proximal leg.Patient denies any dysuria, hematuria, fever, nausea, vomiting, diarrhea, vaginal bleeding or discharge.     Past Medical History  Diagnosis Date  . UTI (lower urinary tract infection)     There are no active problems to display for this patient.   Past Surgical History  Procedure Laterality Date  . Appendectomy    . Tonsillectomy    . Cesarean section      Current Outpatient Rx  Name  Route  Sig  Dispense  Refill  . HYDROcodone-acetaminophen (NORCO) 5-325 MG per tablet   Oral   Take 1 tablet by mouth every 6 (six) hours as needed for moderate pain.   15 tablet   0   . ibuprofen (ADVIL,MOTRIN) 600 MG tablet   Oral   Take 1 tablet (600 mg total) by mouth every 8 (eight) hours as needed.   15 tablet   0   . nitrofurantoin, macrocrystal-monohydrate, (MACROBID) 100 MG capsule   Oral   Take 1 capsule (100 mg total) by mouth 2 (two) times daily.   10 capsule   0   . VESICARE 10 MG tablet   Oral   Take 10 mg by mouth daily.      6     Dispense as written.     Allergies Penicillins  No family history on file.  Social History Social History  Substance Use  Topics  . Smoking status: Never Smoker   . Smokeless tobacco: None  . Alcohol Use: No    Review of Systems Constitutional: Negative for fever. Cardiovascular: Negative for chest pain. Respiratory: Negative for shortness of breath. Gastrointestinal: Very low right lower quadrant abdominal pain. Negative for nausea, vomiting, diarrhea. Genitourinary: Negative for dysuria. Negative for vaginal bleeding or discharge. Musculoskeletal: Negative for back pain. Skin: Negative for rash. 10-point ROS otherwise negative.  ____________________________________________   PHYSICAL EXAM:  VITAL SIGNS: ED Triage Vitals  Enc Vitals Group     BP 08/31/14 0906 137/74 mmHg     Pulse Rate 08/31/14 0906 93     Resp 08/31/14 0906 20     Temp 08/31/14 0906 98 F (36.7 C)     Temp Source 08/31/14 0906 Oral     SpO2 08/31/14 0906 97 %     Weight 08/31/14 0906 147 lb (66.679 kg)     Height 08/31/14 0906 5' (1.524 m)     Head Cir --      Peak Flow --      Pain Score 08/31/14 0908 10     Pain Loc --      Pain Edu? --      Excl. in GC? --     Constitutional: Alert and oriented.  Well appearing and in no distress. Eyes: Normal exam ENT   Mouth/Throat: Mucous membranes are moist. Cardiovascular: Normal rate, regular rhythm. No murmur Respiratory: Normal respiratory effort without tachypnea nor retractions. Breath sounds are clear and equal bilaterally. No wheezes/rales/rhonchi. Gastrointestinal: Soft and nontender. No distention.  Patient's discomfort is extremely far down located directly over her inguinal ligament. Mild to moderate tenderness palpation in this area. No abdominal tenderness to palpation. Does not appear tender over her right lower quadrant, it is more over the right inguinal ligament. Musculoskeletal: No lower extremity edema or calf tenderness to palpation. Neurologic:  Normal speech and language. No gross focal neurologic deficits are appreciated. Speech is normal. Skin:  Skin  is warm, dry and intact.  Psychiatric: Mood and affect are normal. Speech and behavior are normal. Patient exhibits appropriate insight and judgment.  ____________________________________________   INITIAL IMPRESSION / ASSESSMENT AND PLAN / ED COURSE  Pertinent labs & imaging results that were available during my care of the patient were reviewed by me and considered in my medical decision making (see chart for details).  This is the patient's now third visit for right lower abdominal pain/upper leg pain. The patient was diagnosed with urinary tract infection and is taking Macrobid. The patient initially had a noncontrasted abdominal pelvis CT with normal results. On her second visit the patient had a Foley contrasted abdominal pelvis CT with normal results. The patient has had lab work on both occurrences with normal results besides a urinalysis showing many white blood cells. Patient currently on Macrobid, but states she has not improving. The patient states she had the exact same thing happened for years ago, but does not recall what she was ultimately diagnosed with. I looked with my bedside ultrasound, she has fully compressible veins in her leg including common femoral vein.   Pelvic ultrasound within normal limits. We'll discharge the patient home with Percocet, she is continue her Macrobid, follow-up with her primary care doctor tomorrow. Patient agreeable.  ____________________________________________   FINAL CLINICAL IMPRESSION(S) / ED DIAGNOSES  Lower abdominal pain   Minna Antis, MD 08/31/14 1345

## 2014-08-31 NOTE — ED Notes (Signed)
Presents with RLQ pain  Was seen last week for same and dx'd with uti   States pain is worse .

## 2014-08-31 NOTE — Discharge Instructions (Signed)
Abdominal Pain, Women °Abdominal (stomach, pelvic, or belly) pain can be caused by many things. It is important to tell your doctor: °· The location of the pain. °· Does it come and go or is it present all the time? °· Are there things that start the pain (eating certain foods, exercise)? °· Are there other symptoms associated with the pain (fever, nausea, vomiting, diarrhea)? °All of this is helpful to know when trying to find the cause of the pain. °CAUSES  °· Stomach: virus or bacteria infection, or ulcer. °· Intestine: appendicitis (inflamed appendix), regional ileitis (Crohn's disease), ulcerative colitis (inflamed colon), irritable bowel syndrome, diverticulitis (inflamed diverticulum of the colon), or cancer of the stomach or intestine. °· Gallbladder disease or stones in the gallbladder. °· Kidney disease, kidney stones, or infection. °· Pancreas infection or cancer. °· Fibromyalgia (pain disorder). °· Diseases of the female organs: °¨ Uterus: fibroid (non-cancerous) tumors or infection. °¨ Fallopian tubes: infection or tubal pregnancy. °¨ Ovary: cysts or tumors. °¨ Pelvic adhesions (scar tissue). °¨ Endometriosis (uterus lining tissue growing in the pelvis and on the pelvic organs). °¨ Pelvic congestion syndrome (female organs filling up with blood just before the menstrual period). °¨ Pain with the menstrual period. °¨ Pain with ovulation (producing an egg). °¨ Pain with an IUD (intrauterine device, birth control) in the uterus. °¨ Cancer of the female organs. °· Functional pain (pain not caused by a disease, may improve without treatment). °· Psychological pain. °· Depression. °DIAGNOSIS  °Your doctor will decide the seriousness of your pain by doing an examination. °· Blood tests. °· X-rays. °· Ultrasound. °· CT scan (computed tomography, special type of X-ray). °· MRI (magnetic resonance imaging). °· Cultures, for infection. °· Barium enema (dye inserted in the large intestine, to better view it with  X-rays). °· Colonoscopy (looking in intestine with a lighted tube). °· Laparoscopy (minor surgery, looking in abdomen with a lighted tube). °· Major abdominal exploratory surgery (looking in abdomen with a large incision). °TREATMENT  °The treatment will depend on the cause of the pain.  °· Many cases can be observed and treated at home. °· Over-the-counter medicines recommended by your caregiver. °· Prescription medicine. °· Antibiotics, for infection. °· Birth control pills, for painful periods or for ovulation pain. °· Hormone treatment, for endometriosis. °· Nerve blocking injections. °· Physical therapy. °· Antidepressants. °· Counseling with a psychologist or psychiatrist. °· Minor or major surgery. °HOME CARE INSTRUCTIONS  °· Do not take laxatives, unless directed by your caregiver. °· Take over-the-counter pain medicine only if ordered by your caregiver. Do not take aspirin because it can cause an upset stomach or bleeding. °· Try a clear liquid diet (broth or water) as ordered by your caregiver. Slowly move to a bland diet, as tolerated, if the pain is related to the stomach or intestine. °· Have a thermometer and take your temperature several times a day, and record it. °· Bed rest and sleep, if it helps the pain. °· Avoid sexual intercourse, if it causes pain. °· Avoid stressful situations. °· Keep your follow-up appointments and tests, as your caregiver orders. °· If the pain does not go away with medicine or surgery, you may try: °¨ Acupuncture. °¨ Relaxation exercises (yoga, meditation). °¨ Group therapy. °¨ Counseling. °SEEK MEDICAL CARE IF:  °· You notice certain foods cause stomach pain. °· Your home care treatment is not helping your pain. °· You need stronger pain medicine. °· You want your IUD removed. °· You feel faint or   lightheaded. °· You develop nausea and vomiting. °· You develop a rash. °· You are having side effects or an allergy to your medicine. °SEEK IMMEDIATE MEDICAL CARE IF:  °· Your  pain does not go away or gets worse. °· You have a fever. °· Your pain is felt only in portions of the abdomen. The right side could possibly be appendicitis. The left lower portion of the abdomen could be colitis or diverticulitis. °· You are passing blood in your stools (bright red or black tarry stools, with or without vomiting). °· You have blood in your urine. °· You develop chills, with or without a fever. °· You pass out. °MAKE SURE YOU:  °· Understand these instructions. °· Will watch your condition. °· Will get help right away if you are not doing well or get worse. °Document Released: 10/17/2006 Document Revised: 05/06/2013 Document Reviewed: 11/06/2008 °[employer]® Patient Information ©2015 [employer]. This information is not intended to replace advice given to you by your health care provider. Make sure you discuss any questions you have with your health care provider. ° °

## 2014-08-31 NOTE — ED Notes (Signed)
AAOx3.  Skin warm and dry.  Ambulates with easy and steady gait.  NAD.  Posture upright and relaxed.  D/C home

## 2014-09-01 LAB — URINE CULTURE

## 2014-12-09 ENCOUNTER — Ambulatory Visit (INDEPENDENT_AMBULATORY_CARE_PROVIDER_SITE_OTHER): Payer: Medicare Other | Admitting: Obstetrics and Gynecology

## 2014-12-09 ENCOUNTER — Encounter: Payer: Self-pay | Admitting: Obstetrics and Gynecology

## 2014-12-09 VITALS — BP 131/84 | HR 99 | Resp 16 | Ht 60.0 in | Wt 149.3 lb

## 2014-12-09 DIAGNOSIS — R350 Frequency of micturition: Secondary | ICD-10-CM

## 2014-12-09 DIAGNOSIS — R32 Unspecified urinary incontinence: Secondary | ICD-10-CM

## 2014-12-09 DIAGNOSIS — IMO0001 Reserved for inherently not codable concepts without codable children: Secondary | ICD-10-CM

## 2014-12-09 LAB — URINALYSIS, COMPLETE
BILIRUBIN UA: NEGATIVE
Glucose, UA: NEGATIVE
Ketones, UA: NEGATIVE
Nitrite, UA: NEGATIVE
PH UA: 5 (ref 5.0–7.5)
Specific Gravity, UA: 1.025 (ref 1.005–1.030)
Urobilinogen, Ur: 0.2 mg/dL (ref 0.2–1.0)

## 2014-12-09 LAB — MICROSCOPIC EXAMINATION
RBC MICROSCOPIC, UA: NONE SEEN /HPF (ref 0–?)
WBC, UA: >30 /hpf — ABNORMAL HIGH (ref 0–?)

## 2014-12-09 LAB — BLADDER SCAN AMB NON-IMAGING: Scan Result: 31

## 2014-12-09 NOTE — Progress Notes (Signed)
12/09/2014 12:10 PM   Laura Gomez 04-25-1945 [id number]  Referring provider: No referring provider defined for this encounter.  Chief Complaint  Patient presents with  . Urinary Incontinence  . Establish Care    HPI: Patient is a 69 year old female presenting today with complaints of urinary frequency. She reports frequency is every 30 minutes daily. She experiences nocturia 3 times per night. She does reports some urgency without any urge incontinence. He denies any urinary leakage. No dysuria, gross hematuria, flank pain, fevers, sensation of incomplete bladder emptying or weak stream. He reports that she has been taking Vesicare for many years and her symptoms have become progressively worse  She denies any vaginal dryness itching or burning.  History of renal stones.  ESWL X 1 in 1980s. CT performed on 08/29/14 demonstrating bilateral punctate nonobstructing renal stones.    PMH: Past Medical History  Diagnosis Date  . UTI (lower urinary tract infection)   . Kidney stone     Surgical History: Past Surgical History  Procedure Laterality Date  . Appendectomy    . Tonsillectomy    . Cesarean section    . Cataract surgery      Home Medications:    Medication List       This list is accurate as of: 12/09/14 12:10 PM.  Always use your most recent med list.               ibuprofen 600 MG tablet  Commonly known as:  ADVIL,MOTRIN  Take 1 tablet (600 mg total) by mouth every 8 (eight) hours as needed.     VESICARE 10 MG tablet  Generic drug:  solifenacin  Take 10 mg by mouth daily.        Allergies:  Allergies  Allergen Reactions  . Penicillins Rash    Family History: Family History  Problem Relation Age of Onset  . Family history unknown: Yes    Social History:  reports that she has never smoked. She does not have any smokeless tobacco history on file. She reports that she does not drink alcohol. Her drug history is not on  file.  ROS: UROLOGY Frequent Urination?: Yes Hard to postpone urination?: No Burning/pain with urination?: No Get up at night to urinate?: Yes Leakage of urine?: No Urine stream starts and stops?: No Trouble starting stream?: No Do you have to strain to urinate?: No Blood in urine?: No Urinary tract infection?: No Sexually transmitted disease?: No Injury to kidneys or bladder?: No Painful intercourse?: No Weak stream?: No Currently pregnant?: No Vaginal bleeding?: No Last menstrual period?: n/a  Gastrointestinal Nausea?: No Vomiting?: No Indigestion/heartburn?: No Diarrhea?: No Constipation?: No  Constitutional Fever: No Night sweats?: No Weight loss?: No Fatigue?: No  Skin Skin rash/lesions?: No Itching?: No  Eyes Blurred vision?: No Double vision?: No  Ears/Nose/Throat Sore throat?: No Sinus problems?: Yes  Hematologic/Lymphatic Swollen glands?: No Easy bruising?: No  Cardiovascular Leg swelling?: No Chest pain?: No  Respiratory Cough?: No Shortness of breath?: No  Endocrine Excessive thirst?: No  Musculoskeletal Back pain?: No Joint pain?: No  Neurological Headaches?: No Dizziness?: No  Psychologic Depression?: No Anxiety?: No  Physical Exam: BP 131/84 mmHg  Pulse 99  Resp 16  Ht 5' (1.524 m)  Wt 149 lb 4.8 oz (67.722 kg)  BMI 29.16 kg/m2  Constitutional:  Alert and oriented, No acute distress. HEENT: [place] AT, moist mucus membranes.  Trachea midline, no masses. Cardiovascular: No clubbing, cyanosis, or edema. Respiratory: Normal respiratory effort, no  increased work of breathing. GI: Abdomen is soft, nontender, nondistended, no abdominal masses GU: No CVA tenderness.  Skin: No rashes, bruises or suspicious lesions. Lymph: No cervical adenopathy. Neurologic: Grossly intact, no focal deficits, moving all 4 extremities. Psychiatric: Normal mood and affect.  Laboratory Data:   Urinalysis  Pertinent Imaging:   Assessment  & Plan:    1. Urinary Frequency- UA with greater than 30 WBCs and many bacteria.  PVR 31 mL today. Patient instructed to discontinue Vesicare and she was provided 3 weeks of samples of Toviaz 4 mg for her to take daily. She was instructed to complete a bladder diary and bring with her to her follow-up appointment. If Gala Murdochoviaz did not improve her symptoms we will consider starting Myrbetriq. - Urinalysis, Complete - BLADDER SCAN AMB NON-IMAGING   Return in about 3 weeks (around 12/30/2014) for recheck frequency/PVR.  These notes generated with voice recognition software. I apologize for typographical errors.  Earlie LouLindsay Amayiah Gosnell, FNP  [facility] [address], [address] [place], [place] [address] [phone]

## 2014-12-12 ENCOUNTER — Telehealth: Payer: Self-pay

## 2014-12-12 DIAGNOSIS — N390 Urinary tract infection, site not specified: Secondary | ICD-10-CM

## 2014-12-12 LAB — CULTURE, URINE COMPREHENSIVE

## 2014-12-12 MED ORDER — SULFAMETHOXAZOLE-TRIMETHOPRIM 800-160 MG PO TABS: 1.0000 | ORAL_TABLET | Freq: Two times a day (BID) | ORAL | Status: AC

## 2014-12-12 NOTE — Telephone Encounter (Signed)
-----   Message from Fernanda DrumLindsay C Overton, FNP sent at 12/11/2014  2:21 PM EST ----- Please notify patient that her urine culture was positive for infection. Please send in a prescription for Bactrim DS twice daily 1 week. I would like her to return in 3-5 days after she completes antibiotics for a catheterized urine specimen. Thanks

## 2014-12-12 NOTE — Telephone Encounter (Signed)
LMOM-medication sent to pharmacy 

## 2014-12-12 NOTE — Telephone Encounter (Signed)
Spoke with pt in reference to +ucx. Pt voiced understanding and stated she has an appt in later dec and she would like to just do cath specimen then.

## 2014-12-15 ENCOUNTER — Ambulatory Visit: Payer: Self-pay | Admitting: Obstetrics and Gynecology

## 2014-12-31 ENCOUNTER — Ambulatory Visit (INDEPENDENT_AMBULATORY_CARE_PROVIDER_SITE_OTHER): Payer: Medicare Other | Admitting: Obstetrics and Gynecology

## 2014-12-31 ENCOUNTER — Encounter: Payer: Self-pay | Admitting: Obstetrics and Gynecology

## 2014-12-31 VITALS — BP 138/77 | HR 111 | Ht 60.0 in | Wt 147.8 lb

## 2014-12-31 DIAGNOSIS — N3000 Acute cystitis without hematuria: Secondary | ICD-10-CM

## 2014-12-31 DIAGNOSIS — R350 Frequency of micturition: Secondary | ICD-10-CM | POA: Diagnosis not present

## 2014-12-31 LAB — MICROSCOPIC EXAMINATION
Epithelial Cells (non renal): >10 /hpf — ABNORMAL HIGH (ref 0–10)
WBC, UA: >30 /hpf — ABNORMAL HIGH (ref 0–?)

## 2014-12-31 LAB — URINALYSIS, COMPLETE
BILIRUBIN UA: NEGATIVE
Glucose, UA: NEGATIVE
KETONES UA: NEGATIVE
NITRITE UA: NEGATIVE
PH UA: 5 (ref 5.0–7.5)
SPEC GRAV UA: 1.02 (ref 1.005–1.030)
UUROB: 0.2 mg/dL (ref 0.2–1.0)

## 2014-12-31 LAB — BLADDER SCAN AMB NON-IMAGING

## 2014-12-31 NOTE — Addendum Note (Signed)
Addended by: Avie EchevariaMOORE, Luay Balding S on: 12/31/2014 04:59 PM   Modules accepted: Orders

## 2014-12-31 NOTE — Progress Notes (Signed)
3:22 PM   Laura Gomez 07-03-1945 [id number]  Referring provider: Abigail MiyamotoLawrence Edward Perry, MD [address] [place], [place] [id number]  Chief Complaint  Patient presents with  . Urinary Frequency    3wk     HPI: Patient is a 69 year old female presenting today with complaints of urinary frequency. She reports frequency is every 30 minutes daily. She experiences nocturia 3 times per night. She does reports some urgency without any urge incontinence. He denies any urinary leakage. No dysuria, gross hematuria, flank pain, fevers, sensation of incomplete bladder emptying or weak stream. He reports that she has been taking Vesicare for many years and her symptoms have become progressively worse  She denies any vaginal dryness itching or burning.  History of renal stones.  ESWL X 1 in 1980s. CT performed on 08/29/14 demonstrating bilateral punctate nonobstructing renal stones.   Current Status: Patient presents today for follow-up after treatment of urinary tract infection. She was also started on Toviaz at her last visit. She reports no improvement in her symptoms. No fevers or flank pain.  PMH: Past Medical History  Diagnosis Date  . UTI (lower urinary tract infection)   . Kidney stone     Surgical History: Past Surgical History  Procedure Laterality Date  . Appendectomy    . Tonsillectomy    . Cesarean section    . Cataract surgery      Home Medications:    Medication List       This list is accurate as of: 12/31/14  3:22 PM.  Always use your most recent med list.               ibuprofen 600 MG tablet  Commonly known as:  ADVIL,MOTRIN  Take 1 tablet (600 mg total) by mouth every 8 (eight) hours as needed.        Allergies:  Allergies  Allergen Reactions  . Penicillins Rash    Family History: Family History  Problem Relation Age of Onset  . Family history unknown: Yes    Social History:  reports that she has never smoked. She does not have any  smokeless tobacco history on file. She reports that she does not drink alcohol. Her drug history is not on file.  ROS: UROLOGY Frequent Urination?: Yes Hard to postpone urination?: No Burning/pain with urination?: No Get up at night to urinate?: Yes Leakage of urine?: No Urine stream starts and stops?: No Trouble starting stream?: No Do you have to strain to urinate?: No Blood in urine?: No Urinary tract infection?: No Sexually transmitted disease?: No Injury to kidneys or bladder?: No Painful intercourse?: No Weak stream?: No Currently pregnant?: No Vaginal bleeding?: No Last menstrual period?: n  Gastrointestinal Nausea?: No Vomiting?: No Indigestion/heartburn?: No Diarrhea?: No Constipation?: No  Constitutional Fever: No Night sweats?: No Weight loss?: No Fatigue?: No  Skin Skin rash/lesions?: No Itching?: No  Eyes Blurred vision?: No Double vision?: No  Ears/Nose/Throat Sore throat?: No Sinus problems?: No  Hematologic/Lymphatic Swollen glands?: No Easy bruising?: No  Cardiovascular Leg swelling?: No Chest pain?: No  Respiratory Cough?: No Shortness of breath?: No  Endocrine Excessive thirst?: No  Musculoskeletal Back pain?: No Joint pain?: No  Neurological Headaches?: No Dizziness?: No  Psychologic Depression?: No Anxiety?: No  Physical Exam: BP 138/77 mmHg  Pulse 111  Ht 5' (1.524 m)  Wt 147 lb 12.8 oz (67.042 kg)  BMI 28.87 kg/m2  Constitutional:  Alert and oriented, No acute distress. HEENT: [place] AT, moist  mucus membranes.  Trachea midline, no masses. Cardiovascular: No clubbing, cyanosis, or edema. Respiratory: Normal respiratory effort, no increased work of breathing. GI: Abdomen is soft, nontender, nondistended, no abdominal masses GU: No CVA tenderness.  Skin: No rashes, bruises or suspicious lesions. Lymph: No cervical adenopathy. Neurologic: Grossly intact, no focal deficits, moving all 4 extremities. Psychiatric:  Normal mood and affect.  Laboratory Data:   Urinalysis Results for orders placed or performed in visit on 12/31/14  Microscopic Examination  Result Value Ref Range   WBC, UA >30 (H) 0 -  5 /hpf   RBC, UA 11-30 (A) 0 -  2 /hpf   Epithelial Cells (non renal) >10 (H) 0 - 10 /hpf   Renal Epithel, UA 0-10 (A) None seen /hpf   Mucus, UA Present (A) Not Estab.   Bacteria, UA Many (A) None seen/Few  Urinalysis, Complete  Result Value Ref Range   Specific Gravity, UA 1.020 1.005 - 1.030   pH, UA 5.0 5.0 - 7.5   Color, UA Yellow Yellow   Appearance Ur Cloudy (A) Clear   Leukocytes, UA 2+ (A) Negative   Protein, UA Trace (A) Negative/Trace   Glucose, UA Negative Negative   Ketones, UA Negative Negative   RBC, UA 1+ (A) Negative   Bilirubin, UA Negative Negative   Urobilinogen, Ur 0.2 0.2 - 1.0 mg/dL   Nitrite, UA Negative Negative   Microscopic Examination See below:   BLADDER SCAN AMB NON-IMAGING  Result Value Ref Range   Scan Result 36ml     Pertinent Imaging:   Assessment & Plan:    1. Urinary Frequency- patient's UA is once again suspicious for infection. Her PVR is minimal today at 36 mL's. Catheterized urine specimen was obtained to be sent for culture. We will treat pending urine culture results. Patient was provided samples of Myrbetriq.  She will take probably initiation Myrbetriq until her urinary tract infection is treated. We will follow up with her in 1 month recheck her symptoms. Catheterized specimen was negative for RBCs. - Urinalysis, Complete -urine culture - BLADDER SCAN AMB NON-IMAGING   Return in about 1 month (around 01/31/2015).  These notes generated with voice recognition software. I apologize for typographical errors.  Earlie Lou, FNP  [facility] [address], [address] [place], [place] [phone]  i

## 2015-01-02 ENCOUNTER — Telehealth: Payer: Self-pay | Admitting: Obstetrics and Gynecology

## 2015-01-02 LAB — CULTURE, URINE COMPREHENSIVE

## 2015-01-02 MED ORDER — SULFAMETHOXAZOLE-TRIMETHOPRIM 800-160 MG PO TABS: 1.0000 | ORAL_TABLET | Freq: Two times a day (BID) | ORAL | Status: DC

## 2015-01-02 NOTE — Telephone Encounter (Signed)
Spoke with pt in reference to +ucx. Made aware of abx and cath specimen. Pt voiced understanding.

## 2015-01-02 NOTE — Telephone Encounter (Signed)
Notify patient that her urine culture was positive for infection. I sent a prescription for Bactrim to her pharmacy for her to take twice a day 1 week. She needs to keep her follow-up appointment as scheduled. We will need to attain a catheterized urine specimen at her next visit to ensure resolution of urinary tract infection. Please ensure that patient understands that she does not need to provide us with a clean catch specimen at her next visit but she should hold her urine so we can obtain a catheterized specimen. Thanks

## 2015-01-16 ENCOUNTER — Telehealth: Payer: Self-pay

## 2015-01-16 NOTE — Telephone Encounter (Signed)
Pt called this stating she took her myrbetriq and her tongue has started swelling. Advised pt to stop medication immediately. Pt voiced understanding. Pt stated that her breathing is fine and benadryl took away the swelling. Allergy added to list with symptom. Is there another medication pt can have? Please advise.

## 2015-01-19 NOTE — Telephone Encounter (Signed)
She has already tried [place]toviaz.  She can have samples of 25mg  Vesicare. For 2 weeks and then she needs to return for a PVR and recheck.

## 2015-01-19 NOTE — Telephone Encounter (Signed)
Spoke with pt in reference vesicare. Pt stated she took the vesicare for several years and now it doesn't work as well. Pt stated she has already tried [place]toviaz also.

## 2015-01-19 NOTE — Telephone Encounter (Signed)
She needs to come in for an appt discuss other options then.  PTNS etc

## 2015-01-20 NOTE — Telephone Encounter (Signed)
No answer

## 2015-01-22 NOTE — Telephone Encounter (Signed)
Pt called back today to the office, stated that no one has LM for her and that she wanted to follow up on her phone calls from a couple of days ago.  I double checked her phone #'s and they are correct.  Made pt an appt for tomorrow 1/20 at 2:00 to come in to see Lillia Abed to discuss options.

## 2015-01-23 ENCOUNTER — Encounter: Payer: Self-pay | Admitting: Obstetrics and Gynecology

## 2015-01-23 ENCOUNTER — Ambulatory Visit (INDEPENDENT_AMBULATORY_CARE_PROVIDER_SITE_OTHER): Payer: Medicare Other | Admitting: Obstetrics and Gynecology

## 2015-01-23 VITALS — BP 136/84 | HR 94 | Resp 16 | Ht 60.0 in | Wt 152.9 lb

## 2015-01-23 DIAGNOSIS — N952 Postmenopausal atrophic vaginitis: Secondary | ICD-10-CM | POA: Diagnosis not present

## 2015-01-23 DIAGNOSIS — R350 Frequency of micturition: Secondary | ICD-10-CM

## 2015-01-23 LAB — URINALYSIS, COMPLETE
Bilirubin, UA: NEGATIVE
Glucose, UA: NEGATIVE
Ketones, UA: NEGATIVE
LEUKOCYTES UA: NEGATIVE
Nitrite, UA: NEGATIVE
PROTEIN UA: NEGATIVE
Specific Gravity, UA: 1.02 (ref 1.005–1.030)
UUROB: 0.2 mg/dL (ref 0.2–1.0)
pH, UA: 7 (ref 5.0–7.5)

## 2015-01-23 LAB — MICROSCOPIC EXAMINATION
BACTERIA UA: NONE SEEN
EPITHELIAL CELLS (NON RENAL): NONE SEEN /HPF (ref 0–10)

## 2015-01-23 MED ORDER — ESTROGENS, CONJUGATED 0.625 MG/GM VA CREA
TOPICAL_CREAM | VAGINAL | Status: AC
Start: 2015-01-23 — End: ?

## 2015-01-23 NOTE — Progress Notes (Signed)
In and Out Catheterization  Patient is present today for a I & O catheterization per Lillia Abed. Patient was cleaned and prepped in a sterile fashion with betadine and Lidocaine 2% jelly was instilled into the urethra.  A 14FR cath was inserted no complications were noted , 70ml of urine return was noted, urine was clear and yellow in color. A clean urine sample was collected for u/a. Bladder was drained  And catheter was removed with out difficulty.    Preformed by: Rupert Stacks, LPN

## 2015-01-23 NOTE — Progress Notes (Signed)
2:34 PM   Laura Gomez Mar 12, 1945 [id number]  Referring provider: Abigail Miyamoto, MD [address] [place], [place] [address]  Chief Complaint  Patient presents with  . Urinary Frequency  . Follow-up    HPI: Patient is a 70 year old female presenting today with complaints of urinary frequency. She reports frequency is every 30 minutes daily. She experiences nocturia 3 times per night. She does reports some urgency without any urge incontinence. He denies any urinary leakage. No dysuria, gross hematuria, flank pain, fevers, sensation of incomplete bladder emptying or weak stream. He reports that she has been taking Vesicare for many years and her symptoms have become progressively worse  She denies any vaginal dryness itching or burning.  History of renal stones.  ESWL X 1 in 1980s. CT performed on 08/29/14 demonstrating bilateral punctate nonobstructing renal stones.   Current Status: Patient presents today for follow-up after treatment of urinary tract infection and urinary frequency. Patient has tried Gala Murdoch in the past as well as Myrbetriq which she had an allergic reaction to. She reports no improvement in her symptoms on either medication. No fevers or flank pain.  PMH: Past Medical History  Diagnosis Date  . UTI (lower urinary tract infection)   . Kidney stone     Surgical History: Past Surgical History  Procedure Laterality Date  . Appendectomy    . Tonsillectomy    . Cesarean section    . Cataract surgery      Home Medications:    Medication List       This list is accurate as of: 01/23/15  2:34 PM.  Always use your most recent med list.               ibuprofen 600 MG tablet  Commonly known as:  ADVIL,MOTRIN  Take 1 tablet (600 mg total) by mouth every 8 (eight) hours as needed.        Allergies:  Allergies  Allergen Reactions  . Myrbetriq [Mirabegron] Swelling    Tongue swelling  . Penicillins Rash    Family History: Family  History  Problem Relation Age of Onset  . Family history unknown: Yes    Social History:  reports that she has never smoked. She does not have any smokeless tobacco history on file. She reports that she does not drink alcohol. Her drug history is not on file.  ROS: UROLOGY Frequent Urination?: Yes Hard to postpone urination?: No Burning/pain with urination?: No Get up at night to urinate?: No Leakage of urine?: No Urine stream starts and stops?: No Trouble starting stream?: No Do you have to strain to urinate?: No Blood in urine?: No Urinary tract infection?: No Sexually transmitted disease?: No Injury to kidneys or bladder?: No Painful intercourse?: No Weak stream?: No Currently pregnant?: No Vaginal bleeding?: No Last menstrual period?: n  Gastrointestinal Nausea?: No Vomiting?: No Indigestion/heartburn?: No Diarrhea?: No Constipation?: No  Constitutional Fever: No Night sweats?: No Weight loss?: No Fatigue?: No  Skin Skin rash/lesions?: No Itching?: No  Eyes Blurred vision?: No Double vision?: No  Ears/Nose/Throat Sore throat?: No Sinus problems?: No  Hematologic/Lymphatic Swollen glands?: No Easy bruising?: No  Cardiovascular Leg swelling?: No Chest pain?: No  Respiratory Cough?: No Shortness of breath?: No  Endocrine Excessive thirst?: No  Musculoskeletal Back pain?: No Joint pain?: No  Neurological Headaches?: No Dizziness?: No  Psychologic Depression?: No Anxiety?: No  Physical Exam: BP 136/84 mmHg  Pulse 94  Resp 16  Ht 5' (1.524 m)  Wt 152 lb 14.4 oz (69.355 kg)  BMI 29.86 kg/m2  Constitutional:  Alert and oriented, No acute distress. HEENT: [place] AT, moist mucus membranes.  Trachea midline, no masses. Cardiovascular: No clubbing, cyanosis, or edema. Respiratory: Normal respiratory effort, no increased work of breathing. GI: Abdomen is soft, nontender, nondistended, no abdominal masses GU: No CVA tenderness.  Pelvic  Exam: atrophic vaginal mucosa, normal urethral meatus, no rashes or lesions, narrow introitus, no obvious cystocele Skin: No rashes, bruises or suspicious lesions. Lymph: No cervical adenopathy. Neurologic: Grossly intact, no focal deficits, moving all 4 extremities. Psychiatric: Normal mood and affect.  Laboratory Data:   Urinalysis Results for orders placed or performed in visit on 12/31/14  Microscopic Examination  Result Value Ref Range   WBC, UA >30 (H) 0 -  5 /hpf   RBC, UA 11-30 (A) 0 -  2 /hpf   Epithelial Cells (non renal) >10 (H) 0 - 10 /hpf   Renal Epithel, UA 0-10 (A) None seen /hpf   Mucus, UA Present (A) Not Estab.   Bacteria, UA Many (A) None seen/Few  CULTURE, URINE COMPREHENSIVE  Result Value Ref Range   Urine Culture, Comprehensive Final report (A)    Result 1 Klebsiella pneumoniae (A)    ANTIMICROBIAL SUSCEPTIBILITY Comment   Urinalysis, Complete  Result Value Ref Range   Specific Gravity, UA 1.020 1.005 - 1.030   pH, UA 5.0 5.0 - 7.5   Color, UA Yellow Yellow   Appearance Ur Cloudy (A) Clear   Leukocytes, UA 2+ (A) Negative   Protein, UA Trace (A) Negative/Trace   Glucose, UA Negative Negative   Ketones, UA Negative Negative   RBC, UA 1+ (A) Negative   Bilirubin, UA Negative Negative   Urobilinogen, Ur 0.2 0.2 - 1.0 mg/dL   Nitrite, UA Negative Negative   Microscopic Examination See below:   BLADDER SCAN AMB NON-IMAGING  Result Value Ref Range   Scan Result 36ml     Pertinent Imaging:   Assessment & Plan:    1. Urinary Frequency- Catheterized urine specimen obtained today. I reviewed further options of treatment of urinary frequency including PTNS and patient was provided written information to review. We will follow up in 1 month to reassess her symptoms and she will let us know if she is interested in starting PTNS therapy. - Urinalysis, Complete -urine culture - BLADDER SCAN AMB NON-IMAGING  2. Vaginal Atrophy- Prescribed vaginal estrogen  cream.    Return in about 1 month (around 02/23/2015) for recheck urinary frequency, vaginal atrophy.  These notes generated with voice recognition software. I apologize for typographical errors.  Earlie Lou, FNP  [facility] [address], [address] [place], [place] [phone]  i

## 2015-02-02 ENCOUNTER — Ambulatory Visit: Payer: Medicare Other | Admitting: Obstetrics and Gynecology

## 2015-02-24 ENCOUNTER — Ambulatory Visit: Payer: Medicare Other | Admitting: Obstetrics and Gynecology

## 2015-11-17 ENCOUNTER — Other Ambulatory Visit: Payer: Self-pay | Admitting: General Surgery

## 2015-11-17 DIAGNOSIS — K4190 Unilateral femoral hernia, without obstruction or gangrene, not specified as recurrent: Secondary | ICD-10-CM

## 2015-11-25 ENCOUNTER — Ambulatory Visit
Admission: RE | Admit: 2015-11-25 | Discharge: 2015-11-25 | Disposition: A | Discharge status: Home / Self Care | Payer: Medicare Other | Source: Ambulatory Visit | Attending: General Surgery | Admitting: General Surgery

## 2015-11-25 DIAGNOSIS — K4190 Unilateral femoral hernia, without obstruction or gangrene, not specified as recurrent: Secondary | ICD-10-CM

## 2016-04-10 ENCOUNTER — Emergency Department
Admission: EM | Admit: 2016-04-10 | Discharge: 2016-04-10 | Disposition: A | Discharge status: Home / Self Care | Payer: Medicare Other | Attending: Emergency Medicine | Admitting: Emergency Medicine

## 2016-04-10 ENCOUNTER — Emergency Department: Payer: Medicare Other

## 2016-04-10 ENCOUNTER — Encounter: Payer: Self-pay | Admitting: Emergency Medicine

## 2016-04-10 DIAGNOSIS — R05 Cough: Secondary | ICD-10-CM | POA: Diagnosis present

## 2016-04-10 DIAGNOSIS — J40 Bronchitis, not specified as acute or chronic: Secondary | ICD-10-CM | POA: Insufficient documentation

## 2016-04-10 LAB — BASIC METABOLIC PANEL
ANION GAP: 6 (ref 5–15)
BUN: 15 mg/dL (ref 6–20)
CO2: 28 mmol/L (ref 22–32)
CREATININE: 0.82 mg/dL (ref 0.44–1.00)
Calcium: 9 mg/dL (ref 8.9–10.3)
Chloride: 108 mmol/L (ref 101–111)
GFR calc Af Amer: >60 mL/min (ref >60)
Glucose, Bld: 92 mg/dL (ref 65–99)
POTASSIUM: 3.8 mmol/L (ref 3.5–5.1)
SODIUM: 142 mmol/L (ref 135–145)

## 2016-04-10 LAB — CBC
HCT: 39.1 % (ref 35.0–47.0)
HEMOGLOBIN: 13.2 g/dL (ref 12.0–16.0)
MCH: 30.5 pg (ref 26.0–34.0)
MCHC: 33.8 g/dL (ref 32.0–36.0)
MCV: 90.4 fL (ref 80.0–100.0)
Platelets: 309 10*3/uL (ref 150–440)
RBC: 4.32 MIL/uL (ref 3.80–5.20)
RDW: 13.5 % (ref 11.5–14.5)
WBC: 11.3 10*3/uL — AB (ref 3.6–11.0)

## 2016-04-10 LAB — TROPONIN I

## 2016-04-10 LAB — FIBRIN DERIVATIVES D-DIMER (ARMC ONLY): Fibrin derivatives D-dimer (ARMC): 1352.94 — ABNORMAL HIGH (ref 0.00–499.00)

## 2016-04-10 MED ORDER — IOPAMIDOL (ISOVUE-370) INJECTION 76%
75.0000 mL | Freq: Once | INTRAVENOUS | Status: AC | PRN
  Administered 2016-04-10: 75 mL via INTRAVENOUS

## 2016-04-10 NOTE — ED Triage Notes (Signed)
Patient presents to the ED with dry, non-productive cough x 1 week. Patient reports cough is causing some back pain.  Patient reports taking tussionex with no improvement as well as a z-pack that she started Wednesday after her doctor diagnosed her with bronchitis.  Patient is speaking in full sentences and is in no obvious distress at this time.

## 2016-04-10 NOTE — ED Provider Notes (Signed)
[facility] Emergency Department Provider Note  Time seen: 9:38 AM  I have reviewed the triage vital signs and the nursing notes.   HISTORY  Chief Complaint Cough and Nasal Congestion    HPI Laura Gomez is a 71 y.o. female with no past medical history who presents to the emergency department for cough. According to the patient for the past 7-10 days she has had a constant cough with mild shortness of breath. Patient was seen by her primary care doctor and initially prescribed Tussionex now is on oxycodone for the cough without relief per patient. Patient denies any sputum production. She does state mild shortness of breath but denies any chest pain. Denies any pleuritic chest pain. Denies any leg pain or swelling and is not on any estrogen supplements. Patient denies any fever. States her doctor [name] with bronchitis but the cough is not any better so she came to the emergency department for evaluation.  Past Medical History:  Diagnosis Date  . Kidney stone   . UTI (lower urinary tract infection)     There are no active problems to display for this patient.   Past Surgical History:  Procedure Laterality Date  . APPENDECTOMY    . cataract surgery    . CESAREAN SECTION    . TONSILLECTOMY      Prior to Admission medications   Medication Sig Start Date End Date Taking? Authorizing Provider  conjugated estrogens (PREMARIN) vaginal cream Vaginal Estrogen Cream apply a blueberry sized amount to vaginal opening using finger tip nightly for 2 weeks then use 3 times weekly before bed. 01/23/15   Fernanda Drum, FNP  ibuprofen (ADVIL,MOTRIN) 600 MG tablet Take 1 tablet (600 mg total) by mouth every 8 (eight) hours as needed. 08/29/14   Irean Hong, MD    Allergies  Allergen Reactions  . Myrbetriq [Mirabegron] Swelling    Tongue swelling  . Penicillins Rash    Family History  Problem Relation Age of Onset  . Family history unknown: Yes    Social  History Social History  Substance Use Topics  . Smoking status: Never Smoker  . Smokeless tobacco: Never Used  . Alcohol use No    Review of Systems Constitutional: Negative for fever. Cardiovascular: Negative for chest pain. Respiratory: Mild shortness breath. Positive for cough. Nonproductive. Gastrointestinal: Negative for abdominal pain Neurological: Negative for headache 10-point ROS otherwise negative.  ____________________________________________   PHYSICAL EXAM:  VITAL SIGNS: ED Triage Vitals  Enc Vitals Group     BP 04/10/16 0849 117/63     Pulse Rate 04/10/16 0849 (!) 101     Resp 04/10/16 0849 18     Temp 04/10/16 0849 97.8 F (36.6 C)     Temp Source 04/10/16 0849 Oral     SpO2 04/10/16 0849 94 %     Weight 04/10/16 0826 145 lb (65.8 kg)     Height 04/10/16 0826 5' (1.524 m)     Head Circumference --      Peak Flow --      Pain Score 04/10/16 0825 9     Pain Loc --      Pain Edu? --      Excl. in GC? --     Constitutional: Alert and oriented. Well appearing and in no distress. Eyes: Normal exam ENT   Head: Normocephalic and atraumatic.   Mouth/Throat: Mucous membranes are moist. Cardiovascular: Normal rate, regular rhythmAround 100 bpm Respiratory: Normal respiratory effort without tachypnea nor  retractions. Breath sounds are clear. No wheeze. Gastrointestinal: Soft and nontender. No distention.  There is no CVA tenderness. Musculoskeletal: Nontender with normal range of motion in all extremities. No lower extremity tenderness or edema. Neurologic:  Normal speech and language. No gross focal neurologic deficits Skin:  Skin is warm, dry and intact.  Psychiatric: Mood and affect are normal.   ____________________________________________    EKG  EKG reviewed and interpreted by myself shows normal sinus rhythm at 96 bpm, narrow QRS, normal axis, normal intervals, no concerning ST changes.  ____________________________________________     RADIOLOGY  Chest x-ray negative  ____________________________________________   INITIAL IMPRESSION / ASSESSMENT AND PLAN / ED COURSE  Pertinent labs & imaging results that were available during my care of the patient were reviewed by me and considered in my medical decision making (see chart for details).  The patient presents the emergency department with cough for the past 7-10 days and mild shortness breath. Overall the patient appears well, no distress. Denies any chest pain at any point. Denies any leg pain or swelling. The patient does have a heart rate of 101 and a pulse ox of 94% on room air currently. We will check labs including a d-dimer, and closely monitor.  Patient CT scan is negative. We will discharge home with continued care as prescribed by her PCP.  ____________________________________________   FINAL CLINICAL IMPRESSION(S) / ED DIAGNOSES  Cough Bronchitis   Minna Antis, MD 04/10/16 1243

## 2016-04-10 NOTE — ED Notes (Signed)
Dr. Lenard Lance signed the EKG.

## 2017-07-03 ENCOUNTER — Other Ambulatory Visit: Payer: Self-pay | Admitting: Ophthalmology

## 2017-07-03 DIAGNOSIS — H547 Unspecified visual loss: Secondary | ICD-10-CM

## 2017-07-03 DIAGNOSIS — G453 Amaurosis fugax: Secondary | ICD-10-CM

## 2017-07-17 ENCOUNTER — Ambulatory Visit
Admission: RE | Admit: 2017-07-17 | Discharge: 2017-07-17 | Disposition: A | Discharge status: Home / Self Care | Payer: Medicare Other | Source: Ambulatory Visit | Attending: Ophthalmology | Admitting: Ophthalmology

## 2017-07-17 DIAGNOSIS — H547 Unspecified visual loss: Secondary | ICD-10-CM | POA: Diagnosis present

## 2017-07-17 DIAGNOSIS — I619 Nontraumatic intracerebral hemorrhage, unspecified: Secondary | ICD-10-CM | POA: Insufficient documentation

## 2017-07-17 DIAGNOSIS — G453 Amaurosis fugax: Secondary | ICD-10-CM | POA: Insufficient documentation

## 2017-07-17 DIAGNOSIS — I6782 Cerebral ischemia: Secondary | ICD-10-CM | POA: Insufficient documentation

## 2017-07-17 DIAGNOSIS — I10 Essential (primary) hypertension: Secondary | ICD-10-CM | POA: Insufficient documentation

## 2017-07-17 LAB — POCT I-STAT CREATININE: CREATININE: 0.6 mg/dL (ref 0.44–1.00)

## 2017-07-17 MED ORDER — GADOBENATE DIMEGLUMINE 529 MG/ML IV SOLN
15.0000 mL | Freq: Once | INTRAVENOUS | Status: AC | PRN
  Administered 2017-07-17: 14 mL via INTRAVENOUS

## 2020-01-06 DIAGNOSIS — R269 Unspecified abnormalities of gait and mobility: Secondary | ICD-10-CM | POA: Diagnosis not present

## 2020-01-06 DIAGNOSIS — Z6829 Body mass index (BMI) 29.0-29.9, adult: Secondary | ICD-10-CM | POA: Diagnosis not present

## 2020-01-06 DIAGNOSIS — R0600 Dyspnea, unspecified: Secondary | ICD-10-CM | POA: Diagnosis not present

## 2020-01-06 DIAGNOSIS — R631 Polydipsia: Secondary | ICD-10-CM | POA: Diagnosis not present

## 2020-01-26 DIAGNOSIS — I610 Nontraumatic intracerebral hemorrhage in hemisphere, subcortical: Secondary | ICD-10-CM | POA: Diagnosis not present

## 2020-01-26 DIAGNOSIS — R2689 Other abnormalities of gait and mobility: Secondary | ICD-10-CM | POA: Diagnosis not present

## 2020-01-26 DIAGNOSIS — I614 Nontraumatic intracerebral hemorrhage in cerebellum: Secondary | ICD-10-CM | POA: Diagnosis not present

## 2020-01-26 DIAGNOSIS — G319 Degenerative disease of nervous system, unspecified: Secondary | ICD-10-CM | POA: Diagnosis not present

## 2020-01-26 DIAGNOSIS — I6782 Cerebral ischemia: Secondary | ICD-10-CM | POA: Diagnosis not present

## 2020-01-26 DIAGNOSIS — R269 Unspecified abnormalities of gait and mobility: Secondary | ICD-10-CM | POA: Diagnosis not present

## 2020-01-26 DIAGNOSIS — I616 Nontraumatic intracerebral hemorrhage, multiple localized: Secondary | ICD-10-CM | POA: Diagnosis not present

## 2020-02-24 DIAGNOSIS — Z9181 History of falling: Secondary | ICD-10-CM | POA: Diagnosis not present

## 2020-02-24 DIAGNOSIS — R269 Unspecified abnormalities of gait and mobility: Secondary | ICD-10-CM | POA: Diagnosis not present

## 2020-02-24 DIAGNOSIS — Z8679 Personal history of other diseases of the circulatory system: Secondary | ICD-10-CM | POA: Diagnosis not present

## 2020-02-24 DIAGNOSIS — Z139 Encounter for screening, unspecified: Secondary | ICD-10-CM | POA: Diagnosis not present

## 2020-02-24 DIAGNOSIS — Z6829 Body mass index (BMI) 29.0-29.9, adult: Secondary | ICD-10-CM | POA: Diagnosis not present

## 2020-02-24 DIAGNOSIS — R2689 Other abnormalities of gait and mobility: Secondary | ICD-10-CM | POA: Diagnosis not present

## 2020-03-11 DIAGNOSIS — R296 Repeated falls: Secondary | ICD-10-CM | POA: Diagnosis not present

## 2020-03-11 DIAGNOSIS — R4781 Slurred speech: Secondary | ICD-10-CM | POA: Diagnosis not present

## 2020-03-11 DIAGNOSIS — R269 Unspecified abnormalities of gait and mobility: Secondary | ICD-10-CM | POA: Diagnosis not present

## 2020-03-11 DIAGNOSIS — R2689 Other abnormalities of gait and mobility: Secondary | ICD-10-CM | POA: Diagnosis not present

## 2020-03-12 ENCOUNTER — Other Ambulatory Visit: Payer: Self-pay | Admitting: Neurology

## 2020-03-12 DIAGNOSIS — R4781 Slurred speech: Secondary | ICD-10-CM

## 2020-03-12 DIAGNOSIS — I4891 Unspecified atrial fibrillation: Secondary | ICD-10-CM

## 2020-03-16 DIAGNOSIS — R269 Unspecified abnormalities of gait and mobility: Secondary | ICD-10-CM | POA: Diagnosis not present

## 2020-03-16 DIAGNOSIS — J343 Hypertrophy of nasal turbinates: Secondary | ICD-10-CM | POA: Diagnosis not present

## 2020-03-16 DIAGNOSIS — R2689 Other abnormalities of gait and mobility: Secondary | ICD-10-CM | POA: Diagnosis not present

## 2020-03-16 DIAGNOSIS — J3489 Other specified disorders of nose and nasal sinuses: Secondary | ICD-10-CM | POA: Diagnosis not present

## 2020-03-16 DIAGNOSIS — H903 Sensorineural hearing loss, bilateral: Secondary | ICD-10-CM | POA: Diagnosis not present

## 2020-03-16 DIAGNOSIS — J342 Deviated nasal septum: Secondary | ICD-10-CM | POA: Diagnosis not present

## 2020-03-18 DIAGNOSIS — R2689 Other abnormalities of gait and mobility: Secondary | ICD-10-CM | POA: Diagnosis not present

## 2020-03-18 DIAGNOSIS — R296 Repeated falls: Secondary | ICD-10-CM | POA: Diagnosis not present

## 2020-03-25 ENCOUNTER — Ambulatory Visit
Admission: RE | Admit: 2020-03-25 | Discharge: 2020-03-25 | Disposition: A | Discharge status: Home / Self Care | Payer: Medicare PPO | Source: Ambulatory Visit | Attending: Neurology | Admitting: Neurology

## 2020-03-25 ENCOUNTER — Other Ambulatory Visit: Payer: Self-pay

## 2020-03-25 DIAGNOSIS — I6523 Occlusion and stenosis of bilateral carotid arteries: Secondary | ICD-10-CM | POA: Diagnosis not present

## 2020-03-25 DIAGNOSIS — R4781 Slurred speech: Secondary | ICD-10-CM | POA: Insufficient documentation

## 2020-03-25 DIAGNOSIS — I4891 Unspecified atrial fibrillation: Secondary | ICD-10-CM | POA: Diagnosis not present

## 2020-04-28 DIAGNOSIS — R4781 Slurred speech: Secondary | ICD-10-CM | POA: Diagnosis not present

## 2020-04-28 DIAGNOSIS — R2689 Other abnormalities of gait and mobility: Secondary | ICD-10-CM | POA: Diagnosis not present

## 2020-04-28 DIAGNOSIS — R269 Unspecified abnormalities of gait and mobility: Secondary | ICD-10-CM | POA: Diagnosis not present

## 2020-04-28 DIAGNOSIS — R296 Repeated falls: Secondary | ICD-10-CM | POA: Diagnosis not present

## 2020-04-28 DIAGNOSIS — Z8673 Personal history of transient ischemic attack (TIA), and cerebral infarction without residual deficits: Secondary | ICD-10-CM | POA: Diagnosis not present

## 2020-05-07 DIAGNOSIS — I69320 Aphasia following cerebral infarction: Secondary | ICD-10-CM | POA: Diagnosis not present

## 2020-05-07 DIAGNOSIS — Z9049 Acquired absence of other specified parts of digestive tract: Secondary | ICD-10-CM | POA: Diagnosis not present

## 2020-05-07 DIAGNOSIS — R2689 Other abnormalities of gait and mobility: Secondary | ICD-10-CM | POA: Diagnosis not present

## 2020-05-07 DIAGNOSIS — I69398 Other sequelae of cerebral infarction: Secondary | ICD-10-CM | POA: Diagnosis not present

## 2020-05-07 DIAGNOSIS — Z7982 Long term (current) use of aspirin: Secondary | ICD-10-CM | POA: Diagnosis not present

## 2020-05-07 DIAGNOSIS — Z9181 History of falling: Secondary | ICD-10-CM | POA: Diagnosis not present

## 2020-05-07 DIAGNOSIS — R296 Repeated falls: Secondary | ICD-10-CM | POA: Diagnosis not present

## 2020-05-07 DIAGNOSIS — R531 Weakness: Secondary | ICD-10-CM | POA: Diagnosis not present

## 2020-05-12 DIAGNOSIS — I69398 Other sequelae of cerebral infarction: Secondary | ICD-10-CM | POA: Diagnosis not present

## 2020-05-12 DIAGNOSIS — R531 Weakness: Secondary | ICD-10-CM | POA: Diagnosis not present

## 2020-05-12 DIAGNOSIS — I69320 Aphasia following cerebral infarction: Secondary | ICD-10-CM | POA: Diagnosis not present

## 2020-05-12 DIAGNOSIS — Z7982 Long term (current) use of aspirin: Secondary | ICD-10-CM | POA: Diagnosis not present

## 2020-05-12 DIAGNOSIS — R296 Repeated falls: Secondary | ICD-10-CM | POA: Diagnosis not present

## 2020-05-12 DIAGNOSIS — Z9181 History of falling: Secondary | ICD-10-CM | POA: Diagnosis not present

## 2020-05-12 DIAGNOSIS — Z9049 Acquired absence of other specified parts of digestive tract: Secondary | ICD-10-CM | POA: Diagnosis not present

## 2020-05-12 DIAGNOSIS — R2689 Other abnormalities of gait and mobility: Secondary | ICD-10-CM | POA: Diagnosis not present

## 2020-05-13 DIAGNOSIS — R531 Weakness: Secondary | ICD-10-CM | POA: Diagnosis not present

## 2020-05-13 DIAGNOSIS — Z7982 Long term (current) use of aspirin: Secondary | ICD-10-CM | POA: Diagnosis not present

## 2020-05-13 DIAGNOSIS — Z9181 History of falling: Secondary | ICD-10-CM | POA: Diagnosis not present

## 2020-05-13 DIAGNOSIS — I69320 Aphasia following cerebral infarction: Secondary | ICD-10-CM | POA: Diagnosis not present

## 2020-05-13 DIAGNOSIS — R2689 Other abnormalities of gait and mobility: Secondary | ICD-10-CM | POA: Diagnosis not present

## 2020-05-13 DIAGNOSIS — R296 Repeated falls: Secondary | ICD-10-CM | POA: Diagnosis not present

## 2020-05-13 DIAGNOSIS — I69398 Other sequelae of cerebral infarction: Secondary | ICD-10-CM | POA: Diagnosis not present

## 2020-05-13 DIAGNOSIS — Z9049 Acquired absence of other specified parts of digestive tract: Secondary | ICD-10-CM | POA: Diagnosis not present

## 2020-05-14 DIAGNOSIS — R531 Weakness: Secondary | ICD-10-CM | POA: Diagnosis not present

## 2020-05-14 DIAGNOSIS — Z7982 Long term (current) use of aspirin: Secondary | ICD-10-CM | POA: Diagnosis not present

## 2020-05-14 DIAGNOSIS — R2689 Other abnormalities of gait and mobility: Secondary | ICD-10-CM | POA: Diagnosis not present

## 2020-05-14 DIAGNOSIS — Z9181 History of falling: Secondary | ICD-10-CM | POA: Diagnosis not present

## 2020-05-14 DIAGNOSIS — Z9049 Acquired absence of other specified parts of digestive tract: Secondary | ICD-10-CM | POA: Diagnosis not present

## 2020-05-14 DIAGNOSIS — I69320 Aphasia following cerebral infarction: Secondary | ICD-10-CM | POA: Diagnosis not present

## 2020-05-14 DIAGNOSIS — I69398 Other sequelae of cerebral infarction: Secondary | ICD-10-CM | POA: Diagnosis not present

## 2020-05-14 DIAGNOSIS — R296 Repeated falls: Secondary | ICD-10-CM | POA: Diagnosis not present

## 2020-05-15 DIAGNOSIS — Z9049 Acquired absence of other specified parts of digestive tract: Secondary | ICD-10-CM | POA: Diagnosis not present

## 2020-05-15 DIAGNOSIS — R2689 Other abnormalities of gait and mobility: Secondary | ICD-10-CM | POA: Diagnosis not present

## 2020-05-15 DIAGNOSIS — Z7982 Long term (current) use of aspirin: Secondary | ICD-10-CM | POA: Diagnosis not present

## 2020-05-15 DIAGNOSIS — R296 Repeated falls: Secondary | ICD-10-CM | POA: Diagnosis not present

## 2020-05-15 DIAGNOSIS — Z9181 History of falling: Secondary | ICD-10-CM | POA: Diagnosis not present

## 2020-05-15 DIAGNOSIS — R531 Weakness: Secondary | ICD-10-CM | POA: Diagnosis not present

## 2020-05-15 DIAGNOSIS — I69398 Other sequelae of cerebral infarction: Secondary | ICD-10-CM | POA: Diagnosis not present

## 2020-05-15 DIAGNOSIS — I69320 Aphasia following cerebral infarction: Secondary | ICD-10-CM | POA: Diagnosis not present

## 2020-05-18 DIAGNOSIS — R531 Weakness: Secondary | ICD-10-CM | POA: Diagnosis not present

## 2020-05-18 DIAGNOSIS — R2689 Other abnormalities of gait and mobility: Secondary | ICD-10-CM | POA: Diagnosis not present

## 2020-05-18 DIAGNOSIS — I69398 Other sequelae of cerebral infarction: Secondary | ICD-10-CM | POA: Diagnosis not present

## 2020-05-18 DIAGNOSIS — I69320 Aphasia following cerebral infarction: Secondary | ICD-10-CM | POA: Diagnosis not present

## 2020-05-18 DIAGNOSIS — Z9049 Acquired absence of other specified parts of digestive tract: Secondary | ICD-10-CM | POA: Diagnosis not present

## 2020-05-18 DIAGNOSIS — R296 Repeated falls: Secondary | ICD-10-CM | POA: Diagnosis not present

## 2020-05-18 DIAGNOSIS — Z7982 Long term (current) use of aspirin: Secondary | ICD-10-CM | POA: Diagnosis not present

## 2020-05-18 DIAGNOSIS — Z9181 History of falling: Secondary | ICD-10-CM | POA: Diagnosis not present

## 2020-05-19 DIAGNOSIS — Z7982 Long term (current) use of aspirin: Secondary | ICD-10-CM | POA: Diagnosis not present

## 2020-05-19 DIAGNOSIS — Z9181 History of falling: Secondary | ICD-10-CM | POA: Diagnosis not present

## 2020-05-19 DIAGNOSIS — Z9049 Acquired absence of other specified parts of digestive tract: Secondary | ICD-10-CM | POA: Diagnosis not present

## 2020-05-19 DIAGNOSIS — I69320 Aphasia following cerebral infarction: Secondary | ICD-10-CM | POA: Diagnosis not present

## 2020-05-19 DIAGNOSIS — I69398 Other sequelae of cerebral infarction: Secondary | ICD-10-CM | POA: Diagnosis not present

## 2020-05-19 DIAGNOSIS — R296 Repeated falls: Secondary | ICD-10-CM | POA: Diagnosis not present

## 2020-05-19 DIAGNOSIS — R531 Weakness: Secondary | ICD-10-CM | POA: Diagnosis not present

## 2020-05-19 DIAGNOSIS — R2689 Other abnormalities of gait and mobility: Secondary | ICD-10-CM | POA: Diagnosis not present

## 2020-05-20 DIAGNOSIS — Z7982 Long term (current) use of aspirin: Secondary | ICD-10-CM | POA: Diagnosis not present

## 2020-05-20 DIAGNOSIS — R296 Repeated falls: Secondary | ICD-10-CM | POA: Diagnosis not present

## 2020-05-20 DIAGNOSIS — I69398 Other sequelae of cerebral infarction: Secondary | ICD-10-CM | POA: Diagnosis not present

## 2020-05-20 DIAGNOSIS — R2689 Other abnormalities of gait and mobility: Secondary | ICD-10-CM | POA: Diagnosis not present

## 2020-05-20 DIAGNOSIS — R531 Weakness: Secondary | ICD-10-CM | POA: Diagnosis not present

## 2020-05-20 DIAGNOSIS — I69320 Aphasia following cerebral infarction: Secondary | ICD-10-CM | POA: Diagnosis not present

## 2020-05-20 DIAGNOSIS — Z9049 Acquired absence of other specified parts of digestive tract: Secondary | ICD-10-CM | POA: Diagnosis not present

## 2020-05-20 DIAGNOSIS — Z9181 History of falling: Secondary | ICD-10-CM | POA: Diagnosis not present

## 2020-05-21 DIAGNOSIS — Z9049 Acquired absence of other specified parts of digestive tract: Secondary | ICD-10-CM | POA: Diagnosis not present

## 2020-05-21 DIAGNOSIS — R531 Weakness: Secondary | ICD-10-CM | POA: Diagnosis not present

## 2020-05-21 DIAGNOSIS — I69320 Aphasia following cerebral infarction: Secondary | ICD-10-CM | POA: Diagnosis not present

## 2020-05-21 DIAGNOSIS — Z7982 Long term (current) use of aspirin: Secondary | ICD-10-CM | POA: Diagnosis not present

## 2020-05-21 DIAGNOSIS — Z9181 History of falling: Secondary | ICD-10-CM | POA: Diagnosis not present

## 2020-05-21 DIAGNOSIS — I69398 Other sequelae of cerebral infarction: Secondary | ICD-10-CM | POA: Diagnosis not present

## 2020-05-21 DIAGNOSIS — R296 Repeated falls: Secondary | ICD-10-CM | POA: Diagnosis not present

## 2020-05-21 DIAGNOSIS — R2689 Other abnormalities of gait and mobility: Secondary | ICD-10-CM | POA: Diagnosis not present

## 2020-05-25 DIAGNOSIS — I69320 Aphasia following cerebral infarction: Secondary | ICD-10-CM | POA: Diagnosis not present

## 2020-05-25 DIAGNOSIS — R296 Repeated falls: Secondary | ICD-10-CM | POA: Diagnosis not present

## 2020-05-25 DIAGNOSIS — Z7982 Long term (current) use of aspirin: Secondary | ICD-10-CM | POA: Diagnosis not present

## 2020-05-25 DIAGNOSIS — I69398 Other sequelae of cerebral infarction: Secondary | ICD-10-CM | POA: Diagnosis not present

## 2020-05-25 DIAGNOSIS — Z9049 Acquired absence of other specified parts of digestive tract: Secondary | ICD-10-CM | POA: Diagnosis not present

## 2020-05-25 DIAGNOSIS — R2689 Other abnormalities of gait and mobility: Secondary | ICD-10-CM | POA: Diagnosis not present

## 2020-05-25 DIAGNOSIS — R531 Weakness: Secondary | ICD-10-CM | POA: Diagnosis not present

## 2020-05-25 DIAGNOSIS — Z9181 History of falling: Secondary | ICD-10-CM | POA: Diagnosis not present

## 2020-05-26 DIAGNOSIS — Z9181 History of falling: Secondary | ICD-10-CM | POA: Diagnosis not present

## 2020-05-26 DIAGNOSIS — Z7982 Long term (current) use of aspirin: Secondary | ICD-10-CM | POA: Diagnosis not present

## 2020-05-26 DIAGNOSIS — R2689 Other abnormalities of gait and mobility: Secondary | ICD-10-CM | POA: Diagnosis not present

## 2020-05-26 DIAGNOSIS — I69320 Aphasia following cerebral infarction: Secondary | ICD-10-CM | POA: Diagnosis not present

## 2020-05-26 DIAGNOSIS — Z9049 Acquired absence of other specified parts of digestive tract: Secondary | ICD-10-CM | POA: Diagnosis not present

## 2020-05-26 DIAGNOSIS — R296 Repeated falls: Secondary | ICD-10-CM | POA: Diagnosis not present

## 2020-05-26 DIAGNOSIS — R531 Weakness: Secondary | ICD-10-CM | POA: Diagnosis not present

## 2020-05-26 DIAGNOSIS — I69398 Other sequelae of cerebral infarction: Secondary | ICD-10-CM | POA: Diagnosis not present

## 2020-05-28 DIAGNOSIS — Z9049 Acquired absence of other specified parts of digestive tract: Secondary | ICD-10-CM | POA: Diagnosis not present

## 2020-05-28 DIAGNOSIS — R531 Weakness: Secondary | ICD-10-CM | POA: Diagnosis not present

## 2020-05-28 DIAGNOSIS — I69320 Aphasia following cerebral infarction: Secondary | ICD-10-CM | POA: Diagnosis not present

## 2020-05-28 DIAGNOSIS — I69398 Other sequelae of cerebral infarction: Secondary | ICD-10-CM | POA: Diagnosis not present

## 2020-05-28 DIAGNOSIS — R2689 Other abnormalities of gait and mobility: Secondary | ICD-10-CM | POA: Diagnosis not present

## 2020-05-28 DIAGNOSIS — Z9181 History of falling: Secondary | ICD-10-CM | POA: Diagnosis not present

## 2020-05-28 DIAGNOSIS — R296 Repeated falls: Secondary | ICD-10-CM | POA: Diagnosis not present

## 2020-05-28 DIAGNOSIS — Z7982 Long term (current) use of aspirin: Secondary | ICD-10-CM | POA: Diagnosis not present

## 2020-06-02 DIAGNOSIS — I69320 Aphasia following cerebral infarction: Secondary | ICD-10-CM | POA: Diagnosis not present

## 2020-06-02 DIAGNOSIS — R531 Weakness: Secondary | ICD-10-CM | POA: Diagnosis not present

## 2020-06-02 DIAGNOSIS — Z9049 Acquired absence of other specified parts of digestive tract: Secondary | ICD-10-CM | POA: Diagnosis not present

## 2020-06-02 DIAGNOSIS — R296 Repeated falls: Secondary | ICD-10-CM | POA: Diagnosis not present

## 2020-06-02 DIAGNOSIS — Z9181 History of falling: Secondary | ICD-10-CM | POA: Diagnosis not present

## 2020-06-02 DIAGNOSIS — I69398 Other sequelae of cerebral infarction: Secondary | ICD-10-CM | POA: Diagnosis not present

## 2020-06-02 DIAGNOSIS — R2689 Other abnormalities of gait and mobility: Secondary | ICD-10-CM | POA: Diagnosis not present

## 2020-06-02 DIAGNOSIS — Z7982 Long term (current) use of aspirin: Secondary | ICD-10-CM | POA: Diagnosis not present

## 2020-06-04 DIAGNOSIS — R531 Weakness: Secondary | ICD-10-CM | POA: Diagnosis not present

## 2020-06-04 DIAGNOSIS — Z9049 Acquired absence of other specified parts of digestive tract: Secondary | ICD-10-CM | POA: Diagnosis not present

## 2020-06-04 DIAGNOSIS — I69320 Aphasia following cerebral infarction: Secondary | ICD-10-CM | POA: Diagnosis not present

## 2020-06-04 DIAGNOSIS — R296 Repeated falls: Secondary | ICD-10-CM | POA: Diagnosis not present

## 2020-06-04 DIAGNOSIS — I69398 Other sequelae of cerebral infarction: Secondary | ICD-10-CM | POA: Diagnosis not present

## 2020-06-04 DIAGNOSIS — Z7982 Long term (current) use of aspirin: Secondary | ICD-10-CM | POA: Diagnosis not present

## 2020-06-04 DIAGNOSIS — R2689 Other abnormalities of gait and mobility: Secondary | ICD-10-CM | POA: Diagnosis not present

## 2020-06-04 DIAGNOSIS — Z9181 History of falling: Secondary | ICD-10-CM | POA: Diagnosis not present

## 2020-06-08 DIAGNOSIS — Z9049 Acquired absence of other specified parts of digestive tract: Secondary | ICD-10-CM | POA: Diagnosis not present

## 2020-06-08 DIAGNOSIS — I69320 Aphasia following cerebral infarction: Secondary | ICD-10-CM | POA: Diagnosis not present

## 2020-06-08 DIAGNOSIS — R296 Repeated falls: Secondary | ICD-10-CM | POA: Diagnosis not present

## 2020-06-08 DIAGNOSIS — R2689 Other abnormalities of gait and mobility: Secondary | ICD-10-CM | POA: Diagnosis not present

## 2020-06-08 DIAGNOSIS — R531 Weakness: Secondary | ICD-10-CM | POA: Diagnosis not present

## 2020-06-08 DIAGNOSIS — Z9181 History of falling: Secondary | ICD-10-CM | POA: Diagnosis not present

## 2020-06-08 DIAGNOSIS — I69398 Other sequelae of cerebral infarction: Secondary | ICD-10-CM | POA: Diagnosis not present

## 2020-06-08 DIAGNOSIS — Z7982 Long term (current) use of aspirin: Secondary | ICD-10-CM | POA: Diagnosis not present

## 2020-06-16 DIAGNOSIS — R296 Repeated falls: Secondary | ICD-10-CM | POA: Diagnosis not present

## 2020-06-16 DIAGNOSIS — Z9049 Acquired absence of other specified parts of digestive tract: Secondary | ICD-10-CM | POA: Diagnosis not present

## 2020-06-16 DIAGNOSIS — R2689 Other abnormalities of gait and mobility: Secondary | ICD-10-CM | POA: Diagnosis not present

## 2020-06-16 DIAGNOSIS — R531 Weakness: Secondary | ICD-10-CM | POA: Diagnosis not present

## 2020-06-16 DIAGNOSIS — I69398 Other sequelae of cerebral infarction: Secondary | ICD-10-CM | POA: Diagnosis not present

## 2020-06-16 DIAGNOSIS — Z9181 History of falling: Secondary | ICD-10-CM | POA: Diagnosis not present

## 2020-06-16 DIAGNOSIS — I69320 Aphasia following cerebral infarction: Secondary | ICD-10-CM | POA: Diagnosis not present

## 2020-06-16 DIAGNOSIS — Z7982 Long term (current) use of aspirin: Secondary | ICD-10-CM | POA: Diagnosis not present

## 2020-12-28 DIAGNOSIS — I69328 Other speech and language deficits following cerebral infarction: Secondary | ICD-10-CM | POA: Diagnosis not present

## 2020-12-28 DIAGNOSIS — Z7409 Other reduced mobility: Secondary | ICD-10-CM | POA: Diagnosis not present

## 2020-12-28 DIAGNOSIS — I69354 Hemiplegia and hemiparesis following cerebral infarction affecting left non-dominant side: Secondary | ICD-10-CM | POA: Diagnosis not present

## 2020-12-28 DIAGNOSIS — Z7982 Long term (current) use of aspirin: Secondary | ICD-10-CM | POA: Diagnosis not present

## 2020-12-28 DIAGNOSIS — R030 Elevated blood-pressure reading, without diagnosis of hypertension: Secondary | ICD-10-CM | POA: Diagnosis not present

## 2020-12-28 DIAGNOSIS — I739 Peripheral vascular disease, unspecified: Secondary | ICD-10-CM | POA: Diagnosis not present

## 2020-12-28 DIAGNOSIS — Z8249 Family history of ischemic heart disease and other diseases of the circulatory system: Secondary | ICD-10-CM | POA: Diagnosis not present

## 2020-12-28 DIAGNOSIS — E669 Obesity, unspecified: Secondary | ICD-10-CM | POA: Diagnosis not present

## 2020-12-28 DIAGNOSIS — Z6830 Body mass index (BMI) 30.0-30.9, adult: Secondary | ICD-10-CM | POA: Diagnosis not present

## 2021-02-09 DIAGNOSIS — R6889 Other general symptoms and signs: Secondary | ICD-10-CM | POA: Diagnosis not present

## 2021-02-09 DIAGNOSIS — Z6830 Body mass index (BMI) 30.0-30.9, adult: Secondary | ICD-10-CM | POA: Diagnosis not present

## 2021-02-09 DIAGNOSIS — R269 Unspecified abnormalities of gait and mobility: Secondary | ICD-10-CM | POA: Diagnosis not present

## 2021-02-09 DIAGNOSIS — Z1331 Encounter for screening for depression: Secondary | ICD-10-CM | POA: Diagnosis not present

## 2021-02-09 DIAGNOSIS — Z136 Encounter for screening for cardiovascular disorders: Secondary | ICD-10-CM | POA: Diagnosis not present

## 2021-02-09 DIAGNOSIS — M25511 Pain in right shoulder: Secondary | ICD-10-CM | POA: Diagnosis not present

## 2021-02-23 DIAGNOSIS — R2689 Other abnormalities of gait and mobility: Secondary | ICD-10-CM | POA: Diagnosis not present

## 2021-02-23 DIAGNOSIS — M6281 Muscle weakness (generalized): Secondary | ICD-10-CM | POA: Diagnosis not present

## 2021-02-23 DIAGNOSIS — M25511 Pain in right shoulder: Secondary | ICD-10-CM | POA: Diagnosis not present

## 2021-02-26 DIAGNOSIS — M6281 Muscle weakness (generalized): Secondary | ICD-10-CM | POA: Diagnosis not present

## 2021-02-26 DIAGNOSIS — R2689 Other abnormalities of gait and mobility: Secondary | ICD-10-CM | POA: Diagnosis not present

## 2021-02-26 DIAGNOSIS — M25511 Pain in right shoulder: Secondary | ICD-10-CM | POA: Diagnosis not present

## 2021-03-01 DIAGNOSIS — R2689 Other abnormalities of gait and mobility: Secondary | ICD-10-CM | POA: Diagnosis not present

## 2021-03-01 DIAGNOSIS — M6281 Muscle weakness (generalized): Secondary | ICD-10-CM | POA: Diagnosis not present

## 2021-03-01 DIAGNOSIS — M25511 Pain in right shoulder: Secondary | ICD-10-CM | POA: Diagnosis not present

## 2021-03-02 DIAGNOSIS — M19011 Primary osteoarthritis, right shoulder: Secondary | ICD-10-CM | POA: Diagnosis not present

## 2021-03-03 DIAGNOSIS — R2689 Other abnormalities of gait and mobility: Secondary | ICD-10-CM | POA: Diagnosis not present

## 2021-03-03 DIAGNOSIS — M6281 Muscle weakness (generalized): Secondary | ICD-10-CM | POA: Diagnosis not present

## 2021-03-03 DIAGNOSIS — M25511 Pain in right shoulder: Secondary | ICD-10-CM | POA: Diagnosis not present

## 2021-03-08 DIAGNOSIS — R2689 Other abnormalities of gait and mobility: Secondary | ICD-10-CM | POA: Diagnosis not present

## 2021-03-08 DIAGNOSIS — M25511 Pain in right shoulder: Secondary | ICD-10-CM | POA: Diagnosis not present

## 2021-03-08 DIAGNOSIS — M6281 Muscle weakness (generalized): Secondary | ICD-10-CM | POA: Diagnosis not present

## 2021-03-09 ENCOUNTER — Other Ambulatory Visit (INDEPENDENT_AMBULATORY_CARE_PROVIDER_SITE_OTHER): Payer: Self-pay | Admitting: Family Medicine

## 2021-03-09 DIAGNOSIS — R6889 Other general symptoms and signs: Secondary | ICD-10-CM

## 2021-03-11 DIAGNOSIS — R2689 Other abnormalities of gait and mobility: Secondary | ICD-10-CM | POA: Diagnosis not present

## 2021-03-11 DIAGNOSIS — M25511 Pain in right shoulder: Secondary | ICD-10-CM | POA: Diagnosis not present

## 2021-03-11 DIAGNOSIS — M6281 Muscle weakness (generalized): Secondary | ICD-10-CM | POA: Diagnosis not present

## 2021-03-15 DIAGNOSIS — M6281 Muscle weakness (generalized): Secondary | ICD-10-CM | POA: Diagnosis not present

## 2021-03-15 DIAGNOSIS — R2689 Other abnormalities of gait and mobility: Secondary | ICD-10-CM | POA: Diagnosis not present

## 2021-03-15 DIAGNOSIS — M25511 Pain in right shoulder: Secondary | ICD-10-CM | POA: Diagnosis not present

## 2021-03-16 ENCOUNTER — Other Ambulatory Visit: Payer: Self-pay

## 2021-03-16 ENCOUNTER — Ambulatory Visit (INDEPENDENT_AMBULATORY_CARE_PROVIDER_SITE_OTHER): Payer: Medicare PPO

## 2021-03-16 DIAGNOSIS — R6889 Other general symptoms and signs: Secondary | ICD-10-CM

## 2021-03-19 DIAGNOSIS — R2689 Other abnormalities of gait and mobility: Secondary | ICD-10-CM | POA: Diagnosis not present

## 2021-03-19 DIAGNOSIS — M6281 Muscle weakness (generalized): Secondary | ICD-10-CM | POA: Diagnosis not present

## 2021-03-19 DIAGNOSIS — M25511 Pain in right shoulder: Secondary | ICD-10-CM | POA: Diagnosis not present

## 2021-03-22 DIAGNOSIS — M25511 Pain in right shoulder: Secondary | ICD-10-CM | POA: Diagnosis not present

## 2021-03-22 DIAGNOSIS — R2689 Other abnormalities of gait and mobility: Secondary | ICD-10-CM | POA: Diagnosis not present

## 2021-03-22 DIAGNOSIS — M6281 Muscle weakness (generalized): Secondary | ICD-10-CM | POA: Diagnosis not present

## 2021-03-25 DIAGNOSIS — M25511 Pain in right shoulder: Secondary | ICD-10-CM | POA: Diagnosis not present

## 2021-03-25 DIAGNOSIS — M6281 Muscle weakness (generalized): Secondary | ICD-10-CM | POA: Diagnosis not present

## 2021-03-25 DIAGNOSIS — R2689 Other abnormalities of gait and mobility: Secondary | ICD-10-CM | POA: Diagnosis not present

## 2021-03-26 DIAGNOSIS — R1030 Lower abdominal pain, unspecified: Secondary | ICD-10-CM | POA: Diagnosis not present

## 2021-03-26 DIAGNOSIS — R109 Unspecified abdominal pain: Secondary | ICD-10-CM | POA: Diagnosis not present

## 2021-03-26 DIAGNOSIS — Z87442 Personal history of urinary calculi: Secondary | ICD-10-CM | POA: Diagnosis not present

## 2021-03-26 DIAGNOSIS — Z7982 Long term (current) use of aspirin: Secondary | ICD-10-CM | POA: Diagnosis not present

## 2021-03-26 DIAGNOSIS — N132 Hydronephrosis with renal and ureteral calculous obstruction: Secondary | ICD-10-CM | POA: Diagnosis not present

## 2021-03-26 DIAGNOSIS — E7800 Pure hypercholesterolemia, unspecified: Secondary | ICD-10-CM | POA: Diagnosis not present

## 2021-03-26 DIAGNOSIS — Z79899 Other long term (current) drug therapy: Secondary | ICD-10-CM | POA: Diagnosis not present

## 2021-03-26 DIAGNOSIS — R319 Hematuria, unspecified: Secondary | ICD-10-CM | POA: Diagnosis not present

## 2021-03-26 DIAGNOSIS — N200 Calculus of kidney: Secondary | ICD-10-CM | POA: Diagnosis not present

## 2021-03-30 DIAGNOSIS — R1032 Left lower quadrant pain: Secondary | ICD-10-CM | POA: Diagnosis not present

## 2021-03-30 DIAGNOSIS — Z6830 Body mass index (BMI) 30.0-30.9, adult: Secondary | ICD-10-CM | POA: Diagnosis not present

## 2021-03-30 DIAGNOSIS — Z139 Encounter for screening, unspecified: Secondary | ICD-10-CM | POA: Diagnosis not present

## 2021-03-30 DIAGNOSIS — N201 Calculus of ureter: Secondary | ICD-10-CM | POA: Diagnosis not present

## 2021-03-30 DIAGNOSIS — N132 Hydronephrosis with renal and ureteral calculous obstruction: Secondary | ICD-10-CM | POA: Diagnosis not present

## 2021-03-30 DIAGNOSIS — Z9181 History of falling: Secondary | ICD-10-CM | POA: Diagnosis not present

## 2021-03-30 DIAGNOSIS — R109 Unspecified abdominal pain: Secondary | ICD-10-CM | POA: Diagnosis not present

## 2021-03-30 DIAGNOSIS — N200 Calculus of kidney: Secondary | ICD-10-CM | POA: Diagnosis not present

## 2021-03-31 DIAGNOSIS — N201 Calculus of ureter: Secondary | ICD-10-CM | POA: Diagnosis not present

## 2021-04-01 DIAGNOSIS — N132 Hydronephrosis with renal and ureteral calculous obstruction: Secondary | ICD-10-CM | POA: Diagnosis not present

## 2021-04-01 DIAGNOSIS — Z87442 Personal history of urinary calculi: Secondary | ICD-10-CM | POA: Diagnosis not present

## 2021-04-05 DIAGNOSIS — N201 Calculus of ureter: Secondary | ICD-10-CM | POA: Diagnosis not present

## 2021-04-05 DIAGNOSIS — N132 Hydronephrosis with renal and ureteral calculous obstruction: Secondary | ICD-10-CM | POA: Diagnosis not present

## 2021-04-08 DIAGNOSIS — N200 Calculus of kidney: Secondary | ICD-10-CM | POA: Diagnosis not present

## 2021-04-08 DIAGNOSIS — N201 Calculus of ureter: Secondary | ICD-10-CM | POA: Diagnosis not present

## 2021-04-08 DIAGNOSIS — R109 Unspecified abdominal pain: Secondary | ICD-10-CM | POA: Diagnosis not present

## 2021-04-08 DIAGNOSIS — N132 Hydronephrosis with renal and ureteral calculous obstruction: Secondary | ICD-10-CM | POA: Diagnosis not present

## 2021-04-14 DIAGNOSIS — M25511 Pain in right shoulder: Secondary | ICD-10-CM | POA: Diagnosis not present

## 2021-04-14 DIAGNOSIS — M6281 Muscle weakness (generalized): Secondary | ICD-10-CM | POA: Diagnosis not present

## 2021-04-14 DIAGNOSIS — R2689 Other abnormalities of gait and mobility: Secondary | ICD-10-CM | POA: Diagnosis not present

## 2021-05-03 DIAGNOSIS — Z79899 Other long term (current) drug therapy: Secondary | ICD-10-CM | POA: Diagnosis not present

## 2021-05-03 DIAGNOSIS — R269 Unspecified abnormalities of gait and mobility: Secondary | ICD-10-CM | POA: Diagnosis not present

## 2021-05-03 DIAGNOSIS — G319 Degenerative disease of nervous system, unspecified: Secondary | ICD-10-CM | POA: Diagnosis not present

## 2021-05-03 DIAGNOSIS — E7800 Pure hypercholesterolemia, unspecified: Secondary | ICD-10-CM | POA: Diagnosis not present

## 2021-05-05 DIAGNOSIS — N201 Calculus of ureter: Secondary | ICD-10-CM | POA: Diagnosis not present

## 2021-05-05 DIAGNOSIS — N132 Hydronephrosis with renal and ureteral calculous obstruction: Secondary | ICD-10-CM | POA: Diagnosis not present

## 2021-06-17 DIAGNOSIS — Z8679 Personal history of other diseases of the circulatory system: Secondary | ICD-10-CM | POA: Diagnosis not present

## 2021-06-17 DIAGNOSIS — R269 Unspecified abnormalities of gait and mobility: Secondary | ICD-10-CM | POA: Diagnosis not present

## 2021-06-17 DIAGNOSIS — R531 Weakness: Secondary | ICD-10-CM | POA: Diagnosis not present

## 2021-06-17 DIAGNOSIS — R29810 Facial weakness: Secondary | ICD-10-CM | POA: Diagnosis not present

## 2021-07-17 DIAGNOSIS — Z8673 Personal history of transient ischemic attack (TIA), and cerebral infarction without residual deficits: Secondary | ICD-10-CM | POA: Diagnosis not present

## 2021-07-17 DIAGNOSIS — R269 Unspecified abnormalities of gait and mobility: Secondary | ICD-10-CM | POA: Diagnosis not present

## 2021-07-17 DIAGNOSIS — R29810 Facial weakness: Secondary | ICD-10-CM | POA: Diagnosis not present

## 2021-07-26 DIAGNOSIS — Z8673 Personal history of transient ischemic attack (TIA), and cerebral infarction without residual deficits: Secondary | ICD-10-CM | POA: Diagnosis not present

## 2021-07-26 DIAGNOSIS — E785 Hyperlipidemia, unspecified: Secondary | ICD-10-CM | POA: Diagnosis not present

## 2021-07-26 DIAGNOSIS — Z7409 Other reduced mobility: Secondary | ICD-10-CM | POA: Diagnosis not present

## 2021-07-26 DIAGNOSIS — Z7902 Long term (current) use of antithrombotics/antiplatelets: Secondary | ICD-10-CM | POA: Diagnosis not present

## 2021-07-26 DIAGNOSIS — R030 Elevated blood-pressure reading, without diagnosis of hypertension: Secondary | ICD-10-CM | POA: Diagnosis not present

## 2021-07-26 DIAGNOSIS — Z7982 Long term (current) use of aspirin: Secondary | ICD-10-CM | POA: Diagnosis not present

## 2021-07-26 DIAGNOSIS — Z9181 History of falling: Secondary | ICD-10-CM | POA: Diagnosis not present

## 2021-07-26 DIAGNOSIS — Z8249 Family history of ischemic heart disease and other diseases of the circulatory system: Secondary | ICD-10-CM | POA: Diagnosis not present

## 2021-07-26 DIAGNOSIS — Z880 Allergy status to penicillin: Secondary | ICD-10-CM | POA: Diagnosis not present

## 2021-08-25 DIAGNOSIS — R9089 Other abnormal findings on diagnostic imaging of central nervous system: Secondary | ICD-10-CM | POA: Diagnosis not present

## 2021-11-09 DIAGNOSIS — N3281 Overactive bladder: Secondary | ICD-10-CM | POA: Diagnosis not present

## 2021-11-09 DIAGNOSIS — N201 Calculus of ureter: Secondary | ICD-10-CM | POA: Diagnosis not present

## 2021-11-10 DIAGNOSIS — E7800 Pure hypercholesterolemia, unspecified: Secondary | ICD-10-CM | POA: Diagnosis not present

## 2021-11-10 DIAGNOSIS — Z79899 Other long term (current) drug therapy: Secondary | ICD-10-CM | POA: Diagnosis not present

## 2021-11-10 DIAGNOSIS — Z6829 Body mass index (BMI) 29.0-29.9, adult: Secondary | ICD-10-CM | POA: Diagnosis not present

## 2021-11-10 DIAGNOSIS — Z8679 Personal history of other diseases of the circulatory system: Secondary | ICD-10-CM | POA: Diagnosis not present

## 2021-11-10 DIAGNOSIS — R269 Unspecified abnormalities of gait and mobility: Secondary | ICD-10-CM | POA: Diagnosis not present

## 2021-11-10 DIAGNOSIS — R32 Unspecified urinary incontinence: Secondary | ICD-10-CM | POA: Diagnosis not present

## 2022-01-07 DIAGNOSIS — F418 Other specified anxiety disorders: Secondary | ICD-10-CM | POA: Diagnosis not present

## 2022-01-07 DIAGNOSIS — G479 Sleep disorder, unspecified: Secondary | ICD-10-CM | POA: Diagnosis not present

## 2022-02-07 DIAGNOSIS — F418 Other specified anxiety disorders: Secondary | ICD-10-CM | POA: Diagnosis not present

## 2022-05-11 DIAGNOSIS — Z8679 Personal history of other diseases of the circulatory system: Secondary | ICD-10-CM | POA: Diagnosis not present

## 2022-05-11 DIAGNOSIS — G319 Degenerative disease of nervous system, unspecified: Secondary | ICD-10-CM | POA: Diagnosis not present

## 2022-05-11 DIAGNOSIS — R413 Other amnesia: Secondary | ICD-10-CM | POA: Diagnosis not present

## 2022-05-11 DIAGNOSIS — Z1331 Encounter for screening for depression: Secondary | ICD-10-CM | POA: Diagnosis not present

## 2022-05-11 DIAGNOSIS — Z9181 History of falling: Secondary | ICD-10-CM | POA: Diagnosis not present

## 2022-05-11 DIAGNOSIS — E7800 Pure hypercholesterolemia, unspecified: Secondary | ICD-10-CM | POA: Diagnosis not present

## 2022-05-11 DIAGNOSIS — F418 Other specified anxiety disorders: Secondary | ICD-10-CM | POA: Diagnosis not present

## 2022-05-11 DIAGNOSIS — Z79899 Other long term (current) drug therapy: Secondary | ICD-10-CM | POA: Diagnosis not present

## 2022-05-11 DIAGNOSIS — R32 Unspecified urinary incontinence: Secondary | ICD-10-CM | POA: Diagnosis not present

## 2022-05-11 DIAGNOSIS — Z139 Encounter for screening, unspecified: Secondary | ICD-10-CM | POA: Diagnosis not present

## 2022-06-09 DIAGNOSIS — N3281 Overactive bladder: Secondary | ICD-10-CM | POA: Diagnosis not present

## 2022-06-09 DIAGNOSIS — N201 Calculus of ureter: Secondary | ICD-10-CM | POA: Diagnosis not present

## 2022-07-17 IMAGING — US US CAROTID DUPLEX BILAT
1 series · 14 of 24 positions shown · non-contrast
Comparison: Carotid duplex 05/12/2006

CLINICAL DATA: Slurred speech

TIA
Atrial fibrillation
EXAM:
BILATERAL CAROTID DUPLEX ULTRASOUND
TECHNIQUE: Gray scale imaging, color Doppler and duplex ultrasound were
performed of bilateral carotid and vertebral arteries in the neck.

[Series 1: us carotid bilateral · 14 of 66 slices shown]
[im 1/66]
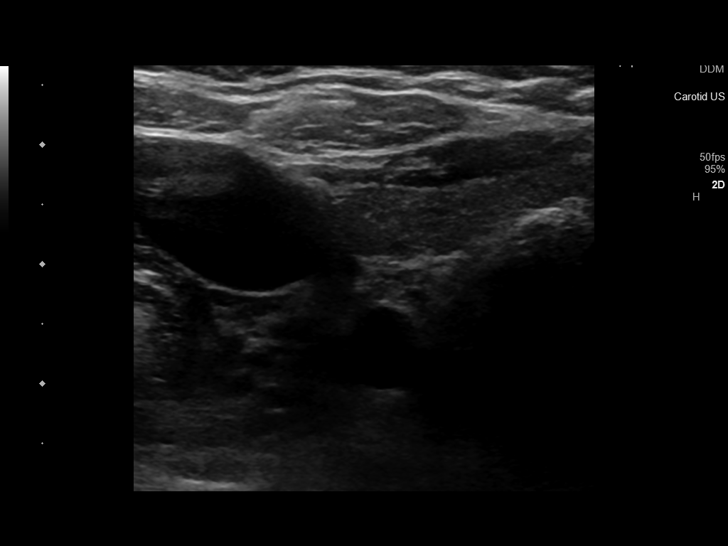
[im 6/66]
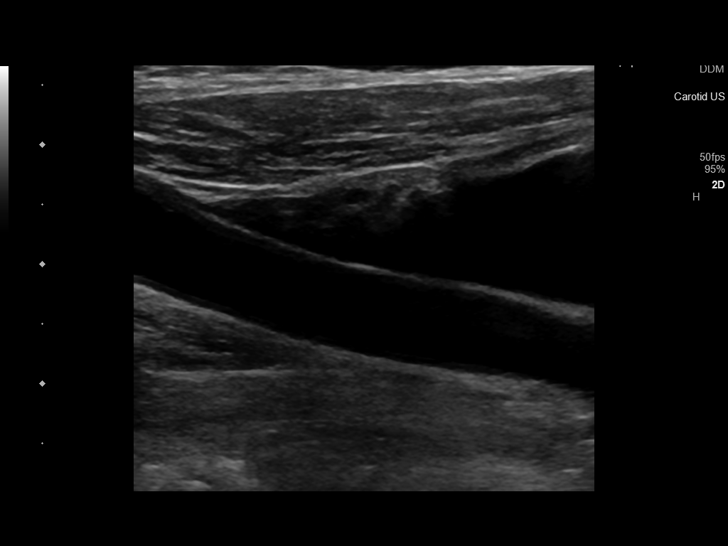
[im 12/66]
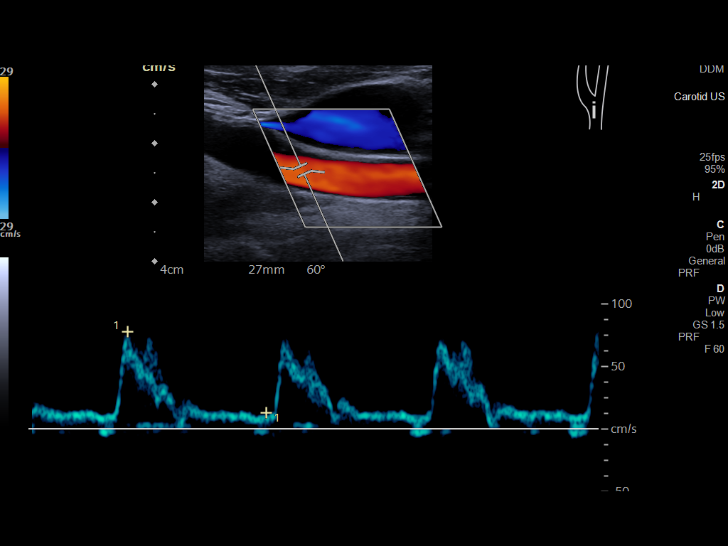
[im 17/66]
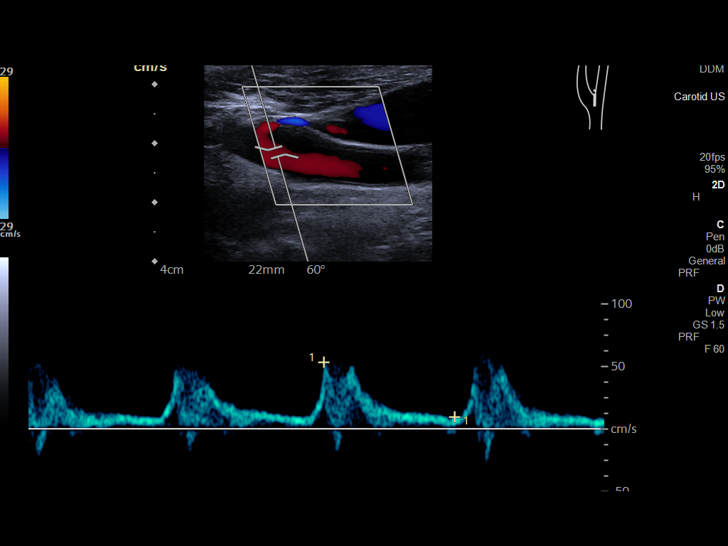
[im 20/66]
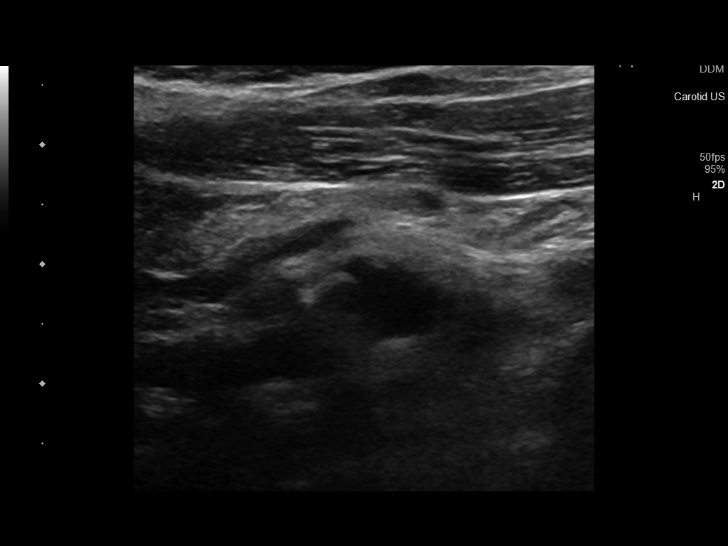
[im 26/66]
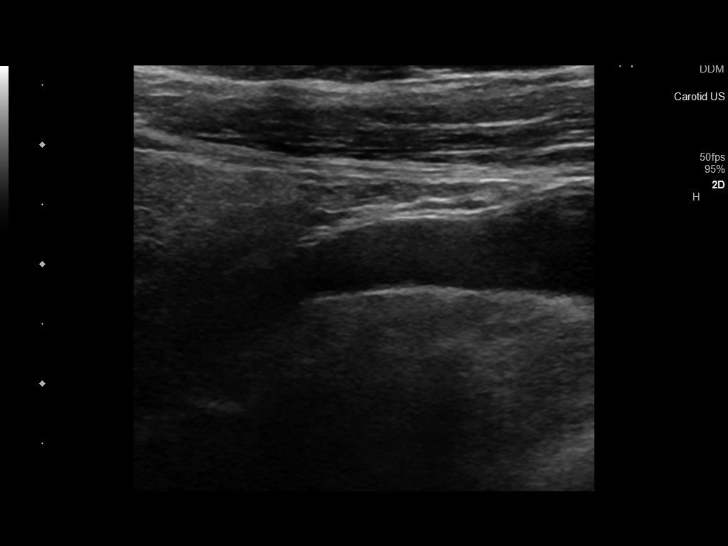
[im 32/66]
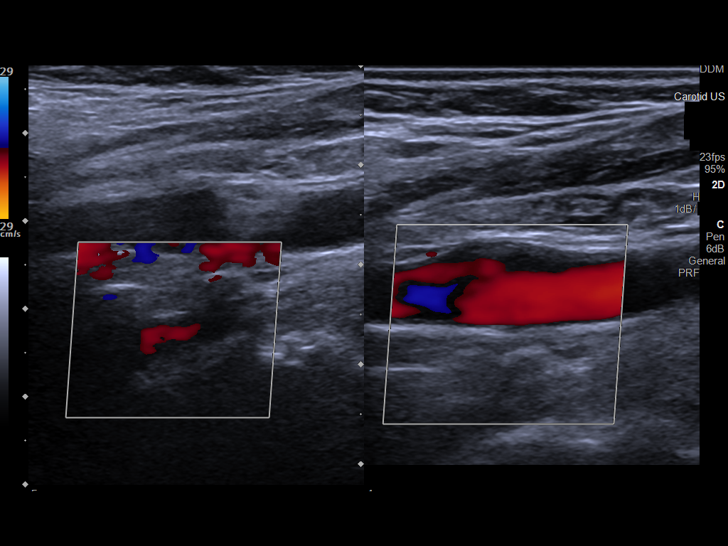
[im 34/66]
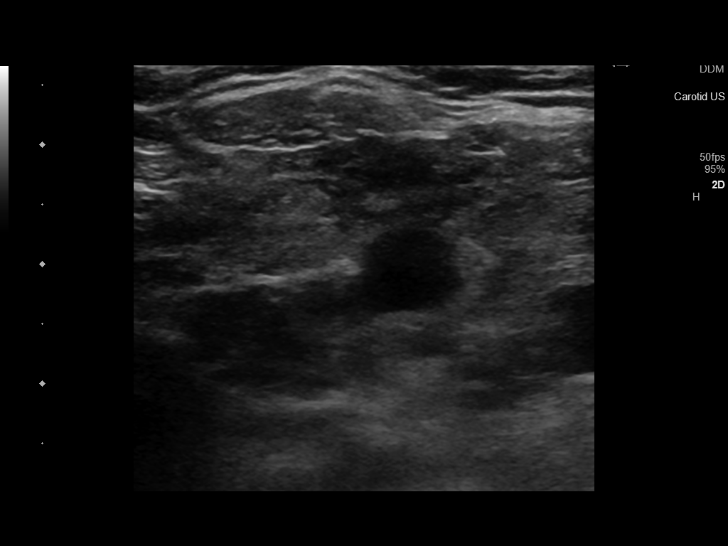
[im 40/66]
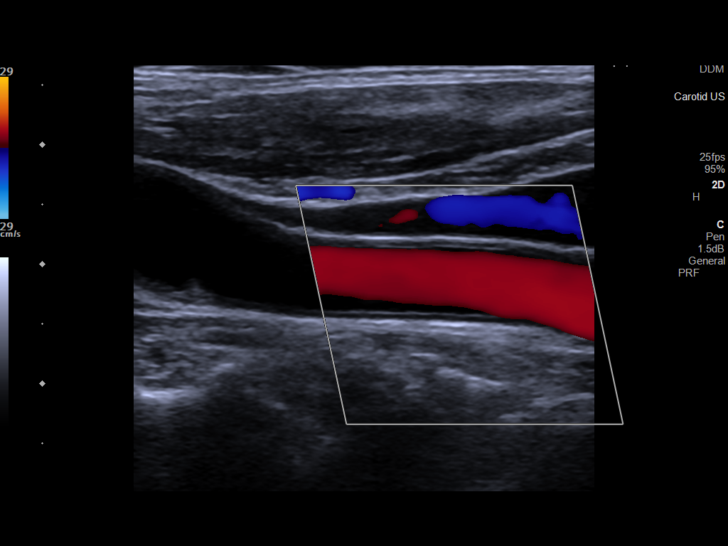
[im 46/66]
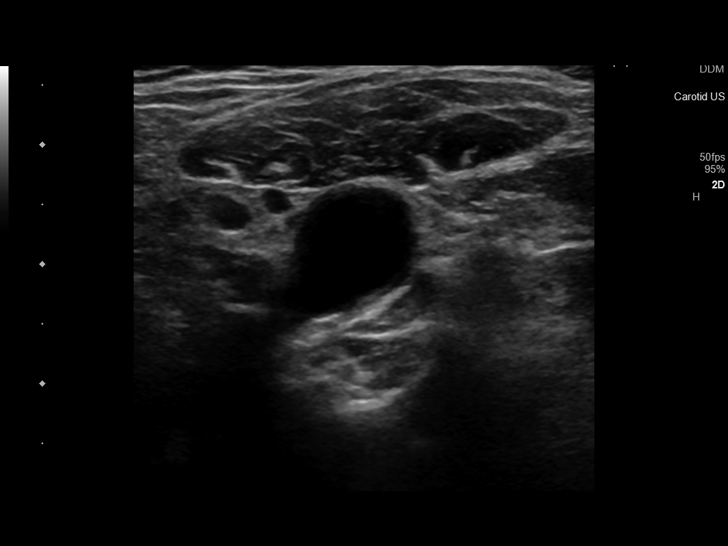
[im 51/66]
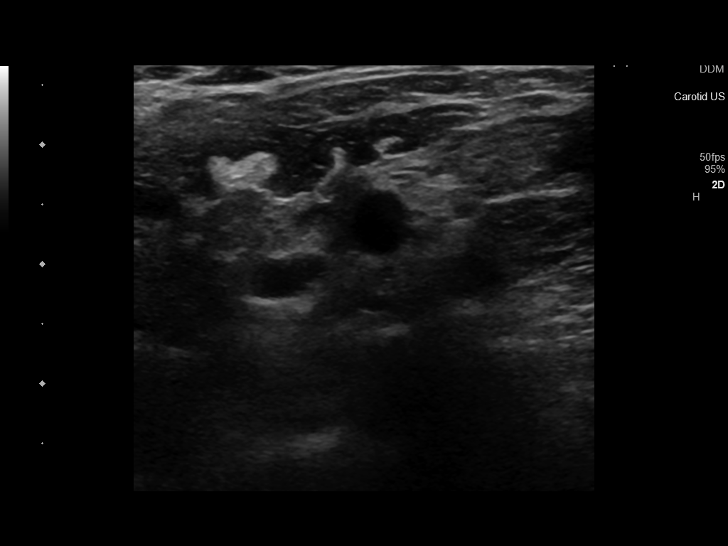
[im 54/66]
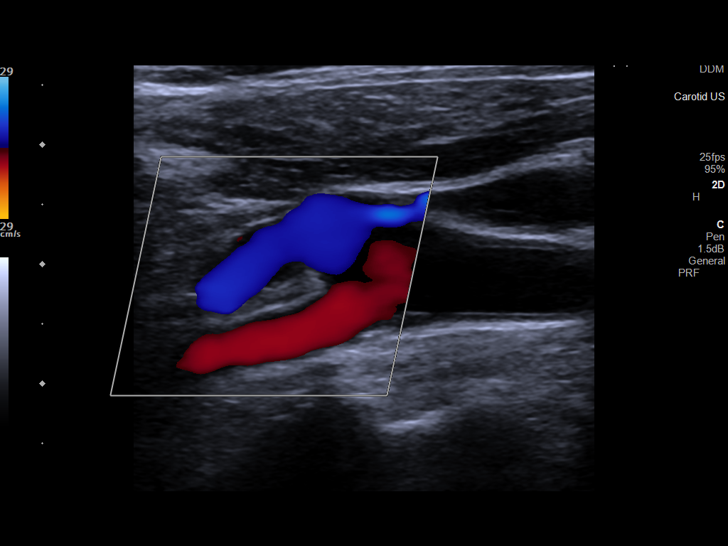
[im 60/66]
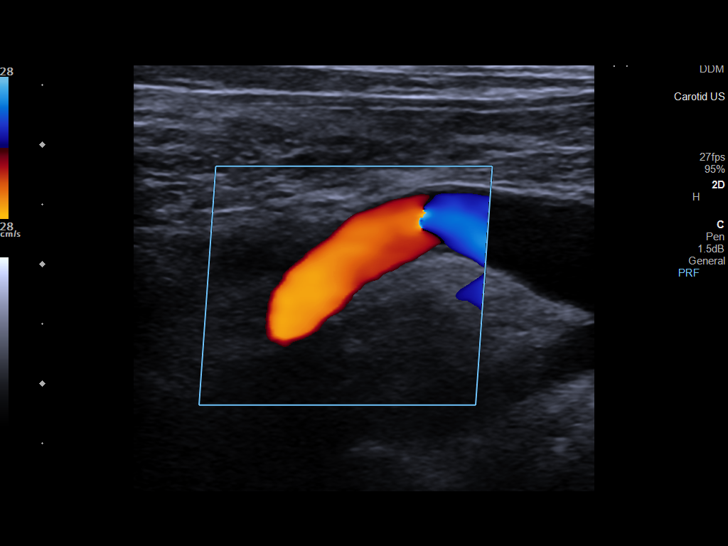
[im 66/66]
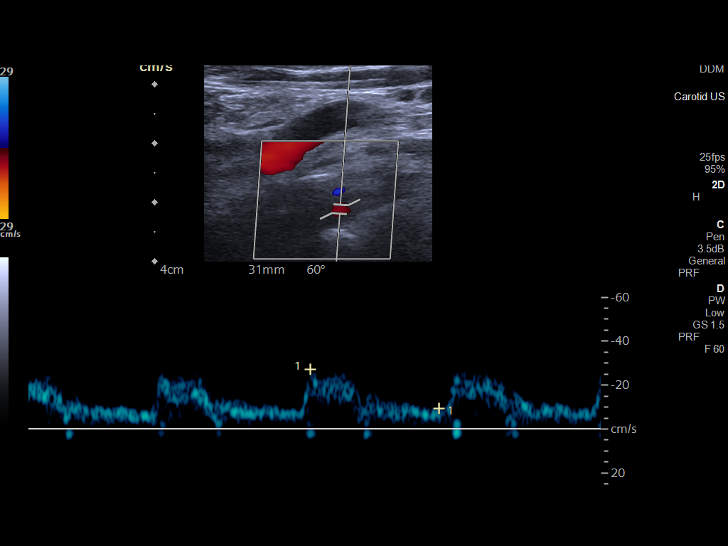

[14 of 24 positions shown; findings below may reference images not displayed]

FINDINGS: Criteria: Quantification of carotid stenosis is based on velocity
parameters that correlate the residual internal carotid diameter
with NASCET-based stenosis levels, using the diameter of the distal
internal carotid lumen as the denominator for stenosis measurement.

The following velocity measurements were obtained:

RIGHT

ICA: 63/7 cm/sec

CCA: 64 flow/7 cm/sec

SYSTOLIC ICA/CCA RATIO:

ECA: 52 cm/sec

LEFT

ICA: 84/10 cm/sec

CCA: 65/14 cm/sec

SYSTOLIC ICA/CCA RATIO:

ECA: 85 cm/sec

RIGHT CAROTID ARTERY: No significant atheromatous plaque.

RIGHT VERTEBRAL ARTERY:  Antegrade flow.

LEFT CAROTID ARTERY: Minimal atheromatous plaque at the carotid
bifurcation without flow-limiting stenosis.

LEFT VERTEBRAL ARTERY:  Antegrade flow.
IMPRESSION: No significant stenosis of internal carotid arteries.

## 2022-11-14 DIAGNOSIS — R159 Full incontinence of feces: Secondary | ICD-10-CM | POA: Diagnosis not present

## 2022-11-14 DIAGNOSIS — Z1331 Encounter for screening for depression: Secondary | ICD-10-CM | POA: Diagnosis not present

## 2022-11-14 DIAGNOSIS — Z79899 Other long term (current) drug therapy: Secondary | ICD-10-CM | POA: Diagnosis not present

## 2022-11-14 DIAGNOSIS — Z8679 Personal history of other diseases of the circulatory system: Secondary | ICD-10-CM | POA: Diagnosis not present

## 2022-11-14 DIAGNOSIS — R32 Unspecified urinary incontinence: Secondary | ICD-10-CM | POA: Diagnosis not present

## 2022-11-14 DIAGNOSIS — G319 Degenerative disease of nervous system, unspecified: Secondary | ICD-10-CM | POA: Diagnosis not present

## 2022-11-14 DIAGNOSIS — F418 Other specified anxiety disorders: Secondary | ICD-10-CM | POA: Diagnosis not present

## 2022-11-14 DIAGNOSIS — F0390 Unspecified dementia without behavioral disturbance: Secondary | ICD-10-CM | POA: Diagnosis not present

## 2022-11-14 DIAGNOSIS — E7800 Pure hypercholesterolemia, unspecified: Secondary | ICD-10-CM | POA: Diagnosis not present

## 2022-12-11 DIAGNOSIS — R109 Unspecified abdominal pain: Secondary | ICD-10-CM | POA: Diagnosis not present

## 2022-12-11 DIAGNOSIS — R1032 Left lower quadrant pain: Secondary | ICD-10-CM | POA: Diagnosis not present

## 2022-12-11 DIAGNOSIS — E785 Hyperlipidemia, unspecified: Secondary | ICD-10-CM | POA: Diagnosis not present

## 2022-12-11 DIAGNOSIS — Z8673 Personal history of transient ischemic attack (TIA), and cerebral infarction without residual deficits: Secondary | ICD-10-CM | POA: Diagnosis not present

## 2022-12-11 DIAGNOSIS — Z87442 Personal history of urinary calculi: Secondary | ICD-10-CM | POA: Diagnosis not present

## 2022-12-16 DIAGNOSIS — R1032 Left lower quadrant pain: Secondary | ICD-10-CM | POA: Diagnosis not present

## 2022-12-16 DIAGNOSIS — F0390 Unspecified dementia without behavioral disturbance: Secondary | ICD-10-CM | POA: Diagnosis not present

## 2022-12-16 DIAGNOSIS — R269 Unspecified abnormalities of gait and mobility: Secondary | ICD-10-CM | POA: Diagnosis not present

## 2022-12-16 DIAGNOSIS — M1612 Unilateral primary osteoarthritis, left hip: Secondary | ICD-10-CM | POA: Diagnosis not present

## 2022-12-16 DIAGNOSIS — M47816 Spondylosis without myelopathy or radiculopathy, lumbar region: Secondary | ICD-10-CM | POA: Diagnosis not present

## 2022-12-20 DIAGNOSIS — R1032 Left lower quadrant pain: Secondary | ICD-10-CM | POA: Diagnosis not present

## 2022-12-23 DIAGNOSIS — R1032 Left lower quadrant pain: Secondary | ICD-10-CM | POA: Diagnosis not present

## 2023-05-15 DIAGNOSIS — R32 Unspecified urinary incontinence: Secondary | ICD-10-CM | POA: Diagnosis not present

## 2023-05-15 DIAGNOSIS — Z8679 Personal history of other diseases of the circulatory system: Secondary | ICD-10-CM | POA: Diagnosis not present

## 2023-05-15 DIAGNOSIS — F418 Other specified anxiety disorders: Secondary | ICD-10-CM | POA: Diagnosis not present

## 2023-05-15 DIAGNOSIS — Z9181 History of falling: Secondary | ICD-10-CM | POA: Diagnosis not present

## 2023-05-15 DIAGNOSIS — F0390 Unspecified dementia without behavioral disturbance: Secondary | ICD-10-CM | POA: Diagnosis not present

## 2023-05-15 DIAGNOSIS — E7800 Pure hypercholesterolemia, unspecified: Secondary | ICD-10-CM | POA: Diagnosis not present

## 2023-05-15 DIAGNOSIS — Z139 Encounter for screening, unspecified: Secondary | ICD-10-CM | POA: Diagnosis not present

## 2023-05-15 DIAGNOSIS — Z79899 Other long term (current) drug therapy: Secondary | ICD-10-CM | POA: Diagnosis not present

## 2023-05-15 DIAGNOSIS — R159 Full incontinence of feces: Secondary | ICD-10-CM | POA: Diagnosis not present

## 2023-12-14 DIAGNOSIS — Z79899 Other long term (current) drug therapy: Secondary | ICD-10-CM | POA: Diagnosis not present

## 2023-12-14 DIAGNOSIS — R159 Full incontinence of feces: Secondary | ICD-10-CM | POA: Diagnosis not present

## 2023-12-14 DIAGNOSIS — Z8679 Personal history of other diseases of the circulatory system: Secondary | ICD-10-CM | POA: Diagnosis not present

## 2023-12-14 DIAGNOSIS — R32 Unspecified urinary incontinence: Secondary | ICD-10-CM | POA: Diagnosis not present

## 2023-12-14 DIAGNOSIS — Z1331 Encounter for screening for depression: Secondary | ICD-10-CM | POA: Diagnosis not present

## 2023-12-14 DIAGNOSIS — E7800 Pure hypercholesterolemia, unspecified: Secondary | ICD-10-CM | POA: Diagnosis not present

## 2023-12-14 DIAGNOSIS — F0390 Unspecified dementia without behavioral disturbance: Secondary | ICD-10-CM | POA: Diagnosis not present

## 2023-12-14 DIAGNOSIS — F418 Other specified anxiety disorders: Secondary | ICD-10-CM | POA: Diagnosis not present
